# Patient Record
Sex: Female | Born: 1995 | Race: White | Hispanic: No | Marital: Single | Smoking: Never smoker
Health system: Southern US, Community
[De-identification: ages and names within clinical notes are randomized; demographics above are authoritative.]

## PROBLEM LIST (undated history)

## (undated) DIAGNOSIS — E8881 Metabolic syndrome: Secondary | ICD-10-CM

## (undated) DIAGNOSIS — E88819 Insulin resistance, unspecified: Secondary | ICD-10-CM

## (undated) DIAGNOSIS — F419 Anxiety disorder, unspecified: Secondary | ICD-10-CM

## (undated) HISTORY — DX: Metabolic syndrome: E88.81

## (undated) HISTORY — DX: Insulin resistance, unspecified: E88.819

## (undated) HISTORY — DX: Anxiety disorder, unspecified: F41.9

---

## 2020-01-28 ENCOUNTER — Encounter: Payer: Self-pay | Admitting: Family Medicine

## 2020-01-28 ENCOUNTER — Ambulatory Visit (INDEPENDENT_AMBULATORY_CARE_PROVIDER_SITE_OTHER): Payer: BC Managed Care – PPO | Admitting: Family Medicine

## 2020-01-28 DIAGNOSIS — F418 Other specified anxiety disorders: Secondary | ICD-10-CM | POA: Diagnosis not present

## 2020-01-28 MED ORDER — ESCITALOPRAM OXALATE 10 MG PO TABS: 10.0000 mg | ORAL_TABLET | Freq: Every day | ORAL | 1 refills | Status: DC

## 2020-01-28 NOTE — Progress Notes (Signed)
Rhonda Hubbard - 24 y.o. female MRN [id number]  Date of birth: 1995-11-16  Subjective Chief Complaint  Patient presents with  . Establish Care    HPI Rhonda Hubbard is a 24 y.o. female here today for initial visit.  She is concerned about anxiety and depression.  She reports that she has had feelings of fatigue, poor sleep, changes to appetite, decreased libido, and concentration difficulty.  She has also had a significant amount of anxiety.  She finds her sleeping difficulty seems to be related to anxiety as she wakes up with ruminating thoughts.  She has felt this way for a few years but has never sought any treatment for this.    She was recently seen by a weight loss physician and had labs completed including testosterone levels, insulin levels and TSH.  She was told that her insulin levels were abnormal.   Depression screen [facility] 2/9 01/28/2020  Decreased Interest 1  Down, Depressed, Hopeless 1  PHQ - 2 Score 2  Altered sleeping 2  Tired, decreased energy 3  Change in appetite 2  Feeling bad or failure about yourself  2  Trouble concentrating 1  Moving slowly or fidgety/restless 0  Suicidal thoughts 0  PHQ-9 Score 12  Difficult doing work/chores Somewhat difficult   ROS:  A comprehensive ROS was completed and negative except as noted per HPI  No Known Allergies  History reviewed. No pertinent past medical history.  History reviewed. No pertinent surgical history.  Social History   Socioeconomic History  . Marital status: Single    Spouse name: Not on file  . Number of children: Not on file  . Years of education: Not on file  . Highest education level: Not on file  Occupational History  . Not on file  Tobacco Use  . Smoking status: Never Smoker  . Smokeless tobacco: Never Used  Vaping Use  . Vaping Use: Never used  Substance and Sexual Activity  . Alcohol use: Yes    Alcohol/week: 1.0 - 2.0 standard drink    Types: 1 - 2 Standard drinks or equivalent per week  .  Drug use: Never  . Sexual activity: Not Currently    Partners: Male    Birth control/protection: Abstinence  Other Topics Concern  . Not on file  Social History Narrative  . Not on file   Social Determinants of Health   Financial Resource Strain:   . Difficulty of Paying Living Expenses: Not on file  Food Insecurity:   . Worried About Programme researcher, broadcasting/film/video in the Last Year: Not on file  . Ran Out of Food in the Last Year: Not on file  Transportation Needs:   . Lack of Transportation (Medical): Not on file  . Lack of Transportation (Non-Medical): Not on file  Physical Activity:   . Days of Exercise per Week: Not on file  . Minutes of Exercise per Session: Not on file  Stress:   . Feeling of Stress : Not on file  Social Connections:   . Frequency of Communication with Friends and Family: Not on file  . Frequency of Social Gatherings with Friends and Family: Not on file  . Attends Religious Services: Not on file  . Active Member of Clubs or Organizations: Not on file  . Attends Banker Meetings: Not on file  . Marital Status: Not on file    Family History  Problem Relation Age of Onset  . Hypertension Father   . Stroke Father   .  Breast cancer Maternal Aunt     Health Maintenance  Topic Date Due  . Hepatitis C Screening  Never done  . HIV Screening  Never done  . PAP-Cervical Cytology Screening  Never done  . PAP SMEAR-Modifier  Never done  . TETANUS/TDAP  08/09/2018  . INFLUENZA VACCINE  Never done  . COVID-19 Vaccine  Completed     ----------------------------------------------------------------------------------------------------------------------------------------------------------------------------------------------------------------- Physical Exam BP 138/71 (BP Location: Left Arm, Patient Position: Sitting, Cuff Size: Normal)   Pulse 80   Temp 98.1 F (36.7 C)   Ht 5\' 3"  (1.6 m)   Wt 189 lb 9.6 oz (86 kg)   LMP 01/24/2020 (Exact Date)   SpO2  100%   BMI 33.59 kg/m   Physical Exam Constitutional:      Appearance: Normal appearance.  HENT:     Head: Normocephalic and atraumatic.  Eyes:     General: No scleral icterus. Cardiovascular:     Rate and Rhythm: Normal rate and regular rhythm.  Musculoskeletal:     Cervical back: Neck supple.  Skin:    General: Skin is warm and dry.  Neurological:     General: No focal deficit present.     Mental Status: She is alert.  Psychiatric:        Mood and Affect: Mood normal.        Behavior: Behavior normal.     ------------------------------------------------------------------------------------------------------------------------------------------------------------------------------------------------------------------- Assessment and Plan  Depression with anxiety Her symptoms are mainly anxiety predominant and we discussed options for management.  Will start lexapro 5mg  initially with increase to 10mg  after 1 week.  Potential side effects reviewed.  Recommend that she consider addition of counseling as well. Follow up in 4-6 weeks.     Meds ordered this encounter  Medications  . escitalopram (LEXAPRO) 10 MG tablet    Sig: Take 1 tablet (10 mg total) by mouth daily. Take 5mg  daily x7 days, then increase to 10mg  daily.    Dispense:  30 tablet    Refill:  1    Return in about 5 weeks (around 03/03/2020) for Anxiety.    This visit occurred during the SARS-CoV-2 public health emergency.  Safety protocols were in place, including screening questions prior to the visit, additional usage of staff PPE, and extensive cleaning of exam room while observing appropriate contact time as indicated for disinfecting solutions.

## 2020-01-28 NOTE — Patient Instructions (Addendum)
Great to meet you today! Let's try lexapro (escitalopram).  Start at 1/2 tab for the first week, then increase to a full tab.   Consider seeing a counselor/therapist as well.  If you need help setting up an appointment let me know.  See me again in 4-6 weeks.    Generalized Anxiety Disorder, Adult Generalized anxiety disorder (GAD) is a mental health disorder. People with this condition constantly worry about everyday events. Unlike normal anxiety, worry related to GAD is not triggered by a specific event. These worries also do not fade or get better with time. GAD interferes with life functions, including relationships, work, and school. GAD can vary from mild to severe. People with severe GAD can have intense waves of anxiety with physical symptoms (panic attacks). What are the causes? The exact cause of GAD is not known. What increases the risk? This condition is more likely to develop in:  Women.  People who have a family history of anxiety disorders.  People who are very shy.  People who experience very stressful life events, such as the death of a loved one.  People who have a very stressful family environment. What are the signs or symptoms? People with GAD often worry excessively about many things in their lives, such as their health and family. They may also be overly concerned about:  Doing well at work.  Being on time.  Natural disasters.  Friendships. Physical symptoms of GAD include:  Fatigue.  Muscle tension or having muscle twitches.  Trembling or feeling shaky.  Being easily startled.  Feeling like your heart is pounding or racing.  Feeling out of breath or like you cannot take a deep breath.  Having trouble falling asleep or staying asleep.  Sweating.  Nausea, diarrhea, or irritable bowel syndrome (IBS).  Headaches.  Trouble concentrating or remembering facts.  Restlessness.  Irritability. How is this diagnosed? Your health care provider  can diagnose GAD based on your symptoms and medical history. You will also have a physical exam. The health care provider will ask specific questions about your symptoms, including how severe they are, when they started, and if they come and go. Your health care provider may ask you about your use of alcohol or drugs, including prescription medicines. Your health care provider may refer you to a mental health specialist for further evaluation. Your health care provider will do a thorough examination and may perform additional tests to rule out other possible causes of your symptoms. To be diagnosed with GAD, a person must have anxiety that:  Is out of his or her control.  Affects several different aspects of his or her life, such as work and relationships.  Causes distress that makes him or her unable to take part in normal activities.  Includes at least three physical symptoms of GAD, such as restlessness, fatigue, trouble concentrating, irritability, muscle tension, or sleep problems. Before your health care provider can confirm a diagnosis of GAD, these symptoms must be present more days than they are not, and they must last for six months or longer. How is this treated? The following therapies are usually used to treat GAD:  Medicine. Antidepressant medicine is usually prescribed for long-term daily control. Antianxiety medicines may be added in severe cases, especially when panic attacks occur.  Talk therapy (psychotherapy). Certain types of talk therapy can be helpful in treating GAD by providing support, education, and guidance. Options include: ? Cognitive behavioral therapy (CBT). People learn coping skills and techniques to ease  their anxiety. They learn to identify unrealistic or negative thoughts and behaviors and to replace them with positive ones. ? Acceptance and commitment therapy (ACT). This treatment teaches people how to be mindful as a way to cope with unwanted thoughts and  feelings. ? Biofeedback. This process trains you to manage your body's response (physiological response) through breathing techniques and relaxation methods. You will work with a therapist while machines are used to monitor your physical symptoms.  Stress management techniques. These include yoga, meditation, and exercise. A mental health specialist can help determine which treatment is best for you. Some people see improvement with one type of therapy. However, other people require a combination of therapies. Follow these instructions at home:  Take over-the-counter and prescription medicines only as told by your health care provider.  Try to maintain a normal routine.  Try to anticipate stressful situations and allow extra time to manage them.  Practice any stress management or self-calming techniques as taught by your health care provider.  Do not punish yourself for setbacks or for not making progress.  Try to recognize your accomplishments, even if they are small.  Keep all follow-up visits as told by your health care provider. This is important. Contact a health care provider if:  Your symptoms do not get better.  Your symptoms get worse.  You have signs of depression, such as: ? A persistently sad, cranky, or irritable mood. ? Loss of enjoyment in activities that used to bring you joy. ? Change in weight or eating. ? Changes in sleeping habits. ? Avoiding friends or family members. ? Loss of energy for normal tasks. ? Feelings of guilt or worthlessness. Get help right away if:  You have serious thoughts about hurting yourself or others. If you ever feel like you may hurt yourself or others, or have thoughts about taking your own life, get help right away. You can go to your nearest emergency department or call:  Your local emergency services (911 in the [place]  A suicide crisis helpline, such as the National Suicide Prevention Lifeline at 979-549-5707. This is open  24 hours a day. Summary  Generalized anxiety disorder (GAD) is a mental health disorder that involves worry that is not triggered by a specific event.  People with GAD often worry excessively about many things in their lives, such as their health and family.  GAD may cause physical symptoms such as restlessness, trouble concentrating, sleep problems, frequent sweating, nausea, diarrhea, headaches, and trembling or muscle twitching.  A mental health specialist can help determine which treatment is best for you. Some people see improvement with one type of therapy. However, other people require a combination of therapies. This information is not intended to replace advice given to you by your health care provider. Make sure you discuss any questions you have with your health care provider. Document Revised: 01/28/2017 Document Reviewed: 01/06/2016 Elsevier Patient Education  2020 [employer].

## 2020-01-28 NOTE — Assessment & Plan Note (Signed)
Her symptoms are mainly anxiety predominant and we discussed options for management.  Will start lexapro 5mg  initially with increase to 10mg  after 1 week.  Potential side effects reviewed.  Recommend that she consider addition of counseling as well. Follow up in 4-6 weeks.

## 2020-01-29 ENCOUNTER — Encounter: Payer: Self-pay | Admitting: Family Medicine

## 2020-01-31 ENCOUNTER — Encounter: Payer: Self-pay | Admitting: Family Medicine

## 2020-01-31 ENCOUNTER — Other Ambulatory Visit: Payer: Self-pay

## 2020-01-31 ENCOUNTER — Ambulatory Visit (INDEPENDENT_AMBULATORY_CARE_PROVIDER_SITE_OTHER): Payer: BC Managed Care – PPO | Admitting: Family Medicine

## 2020-01-31 DIAGNOSIS — F418 Other specified anxiety disorders: Secondary | ICD-10-CM | POA: Diagnosis not present

## 2020-01-31 MED ORDER — LORAZEPAM 0.5 MG PO TABS
0.2500 mg | ORAL_TABLET | Freq: Two times a day (BID) | ORAL | 0 refills | Status: DC | PRN
Start: 2020-01-31 — End: 2020-02-28

## 2020-01-31 NOTE — Progress Notes (Signed)
Rhonda Hubbard - 24 y.o. female MRN [id number]  Date of birth: 02-25-1996  Subjective Chief Complaint  Patient presents with  . Medication Problem    HPI Rhonda Hubbard is a 24 y.o. female here today for follow up of depression and anxiety.  She was started on lexapro at visit last week. She took first dose around 3 days ago.  She had some nausea at first but then developed increased anxiety with racing heart, increased insomnia and shaking.  She had to leave work today because of severity of symptoms.  She has not had any symptoms of allergic reaction.   ROS:  A comprehensive ROS was completed and negative except as noted per HPI  No Known Allergies  History reviewed. No pertinent past medical history.  History reviewed. No pertinent surgical history.  Social History   Socioeconomic History  . Marital status: Single    Spouse name: Not on file  . Number of children: Not on file  . Years of education: Not on file  . Highest education level: Not on file  Occupational History  . Not on file  Tobacco Use  . Smoking status: Never Smoker  . Smokeless tobacco: Never Used  Vaping Use  . Vaping Use: Never used  Substance and Sexual Activity  . Alcohol use: Yes    Alcohol/week: 1.0 - 2.0 standard drink    Types: 1 - 2 Standard drinks or equivalent per week  . Drug use: Never  . Sexual activity: Not Currently    Partners: Male    Birth control/protection: Abstinence  Other Topics Concern  . Not on file  Social History Narrative  . Not on file   Social Determinants of Health   Financial Resource Strain:   . Difficulty of Paying Living Expenses: Not on file  Food Insecurity:   . Worried About Programme researcher, broadcasting/film/video in the Last Year: Not on file  . Ran Out of Food in the Last Year: Not on file  Transportation Needs:   . Lack of Transportation (Medical): Not on file  . Lack of Transportation (Non-Medical): Not on file  Physical Activity:   . Days of Exercise per Week: Not on file   . Minutes of Exercise per Session: Not on file  Stress:   . Feeling of Stress : Not on file  Social Connections:   . Frequency of Communication with Friends and Family: Not on file  . Frequency of Social Gatherings with Friends and Family: Not on file  . Attends Religious Services: Not on file  . Active Member of Clubs or Organizations: Not on file  . Attends Banker Meetings: Not on file  . Marital Status: Not on file    Family History  Problem Relation Age of Onset  . Hypertension Father   . Stroke Father   . Breast cancer Maternal Aunt     Health Maintenance  Topic Date Due  . Hepatitis C Screening  Never done  . HIV Screening  Never done  . PAP-Cervical Cytology Screening  Never done  . PAP SMEAR-Modifier  Never done  . TETANUS/TDAP  08/09/2018  . INFLUENZA VACCINE  Never done  . COVID-19 Vaccine  Completed     ----------------------------------------------------------------------------------------------------------------------------------------------------------------------------------------------------------------- Physical Exam BP (!) 150/89 (BP Location: Left Arm, Patient Position: Sitting, Cuff Size: Normal)   Pulse (!) 105   Temp 98.2 F (36.8 C)   Wt 186 lb 3.2 oz (84.5 kg)   LMP 01/24/2020 (Exact Date)   SpO2  97%   BMI 32.98 kg/m   Physical Exam Constitutional:      Appearance: Normal appearance.  HENT:     Head: Normocephalic and atraumatic.  Neurological:     Mental Status: She is alert.  Psychiatric:        Attention and Perception: Attention normal.        Mood and Affect: Mood is anxious.        Speech: Speech normal.        Behavior: Behavior normal.        Thought Content: Thought content normal.        Cognition and Memory: Cognition normal.        Judgment: Judgment normal.      ------------------------------------------------------------------------------------------------------------------------------------------------------------------------------------------------------------------- Assessment and Plan  Depression with anxiety She seems to be having paradoxical effect related to lexapro.  Will discontinue and give a few days to wash out.  Short term lorazepam prescribed as needed to help mitigate current side effects she is experiencing.  We discussed possibly trying sertraline at next visit.  I will follow up with her in about 1 week.  She is instructed to contact the clinic if symptoms are worsening.     Meds ordered this encounter  Medications  . LORazepam (ATIVAN) 0.5 MG tablet    Sig: Take 0.5-1 tablets (0.25-0.5 mg total) by mouth 2 (two) times daily as needed for anxiety.    Dispense:  10 tablet    Refill:  0    No follow-ups on file.    This visit occurred during the SARS-CoV-2 public health emergency.  Safety protocols were in place, including screening questions prior to the visit, additional usage of staff PPE, and extensive cleaning of exam room while observing appropriate contact time as indicated for disinfecting solutions.

## 2020-01-31 NOTE — Assessment & Plan Note (Signed)
She seems to be having paradoxical effect related to lexapro.  Will discontinue and give a few days to wash out.  Short term lorazepam prescribed as needed to help mitigate current side effects she is experiencing.  We discussed possibly trying sertraline at next visit.  I will follow up with her in about 1 week.  She is instructed to contact the clinic if symptoms are worsening.

## 2020-01-31 NOTE — Telephone Encounter (Signed)
Patient called office upset and worried about possible medication reaction. Patient reports she has taken 3 doses and is experiencing a lot of shaking and she feels her heart is beating very fast. Denies any swelling, chest pain, or shortness of breath. Patient takes medication in PM, last dose was last night.   Patient has been added to schedule for 1:40 today for evaluation. Offered patient earliest appointments and she was not able to make them. She is having someone come get her from work and I advised her it was not safe to drive if she was shaking and not able to work.   Patient aware that if any new or worsening of SX she needs to seek urgent evaluation at [facility] or ED  FYI to PCP

## 2020-01-31 NOTE — Patient Instructions (Signed)
Discontinue lexapro.  Try lorazepam up to two times per day as needed.  Try 1/2 tab initially.  Let's follow up in 1 week.

## 2020-02-06 ENCOUNTER — Telehealth (INDEPENDENT_AMBULATORY_CARE_PROVIDER_SITE_OTHER): Payer: BC Managed Care – PPO | Admitting: Family Medicine

## 2020-02-06 ENCOUNTER — Encounter: Payer: Self-pay | Admitting: Family Medicine

## 2020-02-06 DIAGNOSIS — F418 Other specified anxiety disorders: Secondary | ICD-10-CM | POA: Diagnosis not present

## 2020-02-06 MED ORDER — SERTRALINE HCL 25 MG PO TABS
ORAL_TABLET | ORAL | 3 refills | Status: DC
Start: 2020-02-06 — End: 2020-03-23

## 2020-02-06 MED ORDER — SERTRALINE HCL 25 MG PO TABS: ORAL_TABLET | ORAL | 3 refills | Status: DC

## 2020-02-06 NOTE — Assessment & Plan Note (Addendum)
We will start sertraline as discussed.  Start initially at 12.5 mg with increase to 25 mg after 1 week.  She may utilize leftover lorazepam if needed for now she is having increased anxiety while starting with medication.  I will plan to follow-up with her in about 2 weeks to be sure she is tolerating medication.

## 2020-02-06 NOTE — Progress Notes (Signed)
Rhonda Hubbard - 24 y.o. female MRN [id number]  Date of birth: [birth year]   This visit type was conducted due to national recommendations for restrictions regarding the COVID-19 Pandemic (e.g. social distancing).  This format is felt to be most appropriate for this patient at this time.  All issues noted in this document were discussed and addressed.  No physical exam was performed (except for noted visual exam findings with Video Visits).  I discussed the limitations of evaluation and management by telemedicine and the availability of in person appointments. The patient expressed understanding and agreed to proceed.  I connected with@ on 02/06/20 at  1:00 PM EST by a video enabled telemedicine application and verified that I am speaking with the correct person using two identifiers.  Present at visit: Everrett Coombe, DO [facility]   Patient Location: School/Work [address] [place] [address]   Provider location:   Home  Chief Complaint  Patient presents with  . Anxiety    HPI  Rhonda Hubbard is a 24 y.o. female who presents via audio/video conferencing for a telehealth visit today.  [name] is following up today for depression and anxiety.  We had initially tried Lexapro however she had paradoxical reaction to this and significant increase in anxiety.  I had her discontinue this with a washout period over the past week.  I did provide short-term lorazepam as needed to help with her increased anxiety.  She did utilize this a couple of times and found this to be helpful.  Discussed her this is not for long-term management of her anxiety.  We did discuss trying sertraline to replace Lexapro.  She is open to trying this but is somewhat worried about possible side effects.  We did also discuss maybe adding BuSpar if needed if she continues to have anxiety.     ROS:  A comprehensive ROS was completed and negative except as noted per HPI  No past medical history on file.  No  past surgical history on file.  Family History  Problem Relation Age of Onset  . Hypertension Father   . Stroke Father   . Breast cancer Maternal Aunt     Social History   Socioeconomic History  . Marital status: Single    Spouse name: Not on file  . Number of children: Not on file  . Years of education: Not on file  . Highest education level: Not on file  Occupational History  . Not on file  Tobacco Use  . Smoking status: Never Smoker  . Smokeless tobacco: Never Used  Vaping Use  . Vaping Use: Never used  Substance and Sexual Activity  . Alcohol use: Yes    Alcohol/week: 1.0 - 2.0 standard drink    Types: 1 - 2 Standard drinks or equivalent per week  . Drug use: Never  . Sexual activity: Not Currently    Partners: Male    Birth control/protection: Abstinence  Other Topics Concern  . Not on file  Social History Narrative  . Not on file   Social Determinants of Health   Financial Resource Strain:   . Difficulty of Paying Living Expenses: Not on file  Food Insecurity:   . Worried About Programme researcher, broadcasting/film/video in the Last Year: Not on file  . Ran Out of Food in the Last Year: Not on file  Transportation Needs:   . Lack of Transportation (Medical): Not on file  . Lack of Transportation (Non-Medical): Not on file  Physical Activity:   . Days of Exercise per Week: Not on file  . Minutes of Exercise per Session: Not on file  Stress:   . Feeling of Stress : Not on file  Social Connections:   . Frequency of Communication with Friends and Family: Not on file  . Frequency of Social Gatherings with Friends and Family: Not on file  . Attends Religious Services: Not on file  . Active Member of Clubs or Organizations: Not on file  . Attends Banker Meetings: Not on file  . Marital Status: Not on file  Intimate Partner Violence:   . Fear of Current or Ex-Partner: Not on file  . Emotionally Abused: Not on file  . Physically Abused: Not on file  . Sexually  Abused: Not on file     Current Outpatient Medications:  .  LORazepam (ATIVAN) 0.5 MG tablet, Take 0.5-1 tablets (0.25-0.5 mg total) by mouth 2 (two) times daily as needed for anxiety., Disp: 10 tablet, Rfl: 0 .  sertraline (ZOLOFT) 25 MG tablet, Take 12.5mg  daily x7 days then increase to 25mg  daily, Disp: 30 tablet, Rfl: 3  EXAM:  VITALS per patient if applicable: LMP 01/24/2020 (Exact Date)   GENERAL: alert, oriented, appears well and in no acute distress  HEENT: atraumatic, conjunttiva clear, no obvious abnormalities on inspection of external nose and ears  NECK: normal movements of the head and neck  LUNGS: on inspection no signs of respiratory distress, breathing rate appears normal, no obvious gross SOB, gasping or wheezing  CV: no obvious cyanosis  MS: moves all visible extremities without noticeable abnormality  PSYCH/NEURO: pleasant and cooperative, no obvious depression or anxiety, speech and thought processing grossly intact  ASSESSMENT AND PLAN:  Discussed the following assessment and plan:  Depression with anxiety We will start sertraline as discussed.  Start initially at 12.5 mg with increase to 25 mg after 1 week.  She may utilize leftover lorazepam if needed for now she is having increased anxiety while starting with medication.  I will plan to follow-up with her in about 2 weeks to be sure she is tolerating medication.     I discussed the assessment and treatment plan with the patient. The patient was provided an opportunity to ask questions and all were answered. The patient agreed with the plan and demonstrated an understanding of the instructions.   The patient was advised to call back or seek an in-person evaluation if the symptoms worsen or if the condition fails to improve as anticipated.    01/26/2020, DO

## 2020-02-19 ENCOUNTER — Encounter: Payer: Self-pay | Admitting: Family Medicine

## 2020-02-19 ENCOUNTER — Other Ambulatory Visit: Payer: Self-pay

## 2020-02-19 ENCOUNTER — Ambulatory Visit (INDEPENDENT_AMBULATORY_CARE_PROVIDER_SITE_OTHER): Payer: BC Managed Care – PPO | Admitting: Family Medicine

## 2020-02-19 VITALS — BP 128/82 | HR 87 | Temp 97.4°F | Ht 62.8 in | Wt 184.7 lb

## 2020-02-19 DIAGNOSIS — Z111 Encounter for screening for respiratory tuberculosis: Secondary | ICD-10-CM | POA: Diagnosis not present

## 2020-02-19 DIAGNOSIS — Z23 Encounter for immunization: Secondary | ICD-10-CM | POA: Diagnosis not present

## 2020-02-19 DIAGNOSIS — Z124 Encounter for screening for malignant neoplasm of cervix: Secondary | ICD-10-CM | POA: Diagnosis not present

## 2020-02-19 DIAGNOSIS — Z0000 Encounter for general adult medical examination without abnormal findings: Secondary | ICD-10-CM | POA: Insufficient documentation

## 2020-02-19 DIAGNOSIS — R5383 Other fatigue: Secondary | ICD-10-CM | POA: Diagnosis not present

## 2020-02-19 LAB — COMPLETE METABOLIC PANEL WITH GFR
AG Ratio: 1.8 (calc) (ref 1.0–2.5)
ALT: 12 U/L (ref 6–29)
AST: 13 U/L (ref 10–30)
Albumin: 4.6 g/dL (ref 3.6–5.1)
Alkaline phosphatase (APISO): 59 U/L (ref 31–125)
BUN/Creatinine Ratio: 9 (calc) (ref 6–22)
BUN: 6 mg/dL — ABNORMAL LOW (ref 7–25)
CO2: 25 mmol/L (ref 20–32)
Calcium: 9.4 mg/dL (ref 8.6–10.2)
Chloride: 105 mmol/L (ref 98–110)
Creat: 0.67 mg/dL (ref 0.50–1.10)
GFR, Est African American: 144 mL/min/{1.73_m2} (ref >=60)
GFR, Est Non African American: 124 mL/min/{1.73_m2} (ref >=60)
Globulin: 2.6 g/dL (calc) (ref 1.9–3.7)
Glucose, Bld: 88 mg/dL (ref 65–99)
Potassium: 4.2 mmol/L (ref 3.5–5.3)
Sodium: 139 mmol/L (ref 135–146)
Total Bilirubin: 0.7 mg/dL (ref 0.2–1.2)
Total Protein: 7.2 g/dL (ref 6.1–8.1)

## 2020-02-19 LAB — CBC
HCT: 44.1 % (ref 35.0–45.0)
Hemoglobin: 15.6 g/dL — ABNORMAL HIGH (ref 11.7–15.5)
MCH: 32.8 pg (ref 27.0–33.0)
MCHC: 35.4 g/dL (ref 32.0–36.0)
MCV: 92.6 fL (ref 80.0–100.0)
MPV: 11.3 fL (ref 7.5–12.5)
Platelets: 282 10*3/uL (ref 140–400)
RBC: 4.76 10*6/uL (ref 3.80–5.10)
RDW: 12.2 % (ref 11.0–15.0)
WBC: 9 10*3/uL (ref 3.8–10.8)

## 2020-02-19 LAB — VITAMIN B12: Vitamin B-12: 687 pg/mL (ref 200–1100)

## 2020-02-19 LAB — TSH: TSH: 1.39 mIU/L

## 2020-02-19 NOTE — Progress Notes (Signed)
Pt is here for PPD placement.  Location: right forearm Time: 10:30am  Patient tolerated injection well without complications.

## 2020-02-19 NOTE — Progress Notes (Signed)
Rhonda Hubbard - 24 y.o. female MRN [id number]  Date of birth: Jun 11, 1995  Subjective Chief Complaint  Patient presents with  . Annual Exam    HPI Rhonda Hubbard is a 24 year old female here today for annual exam.  She has a history of anxiety which we are working on managing.  She does feel little better now that school is out for winter break.  She had initially started on Lexapro however did not tolerate.  She has most recently started on sertraline.  She has felt a little panicky with some palpitations at times since starting this but this seems to be improving.  She has also had some diarrhea which also seems to be improving some.  She would like to have updated labs today.  She has had difficulty with weight loss and would like to have labs for thyroid check.  She is a non-smoker.  She consumes alcohol occasionally in social settings.  She exercises occasionally and has been working on making changes to her diet.  She has never had pap, would like referral to GYN  Needs TB test for school.   Review of Systems  Constitutional: Negative for chills, fever, malaise/fatigue and weight loss.  HENT: Negative for congestion, ear pain and sore throat.   Eyes: Negative for blurred vision, double vision and pain.  Respiratory: Negative for cough and shortness of breath.   Cardiovascular: Negative for chest pain and palpitations.  Gastrointestinal: Negative for abdominal pain, blood in stool, constipation, heartburn and nausea.  Genitourinary: Negative for dysuria and urgency.  Musculoskeletal: Negative for joint pain and myalgias.  Neurological: Negative for dizziness and headaches.  Endo/Heme/Allergies: Does not bruise/bleed easily.  Psychiatric/Behavioral: Negative for depression. The patient is not nervous/anxious and does not have insomnia.      No Known Allergies  History reviewed. No pertinent past medical history.  History reviewed. No pertinent surgical history.  Social  History   Socioeconomic History  . Marital status: Single    Spouse name: Not on file  . Number of children: Not on file  . Years of education: Not on file  . Highest education level: Not on file  Occupational History  . Not on file  Tobacco Use  . Smoking status: Never Smoker  . Smokeless tobacco: Never Used  Vaping Use  . Vaping Use: Never used  Substance and Sexual Activity  . Alcohol use: Yes    Alcohol/week: 1.0 - 2.0 standard drink    Types: 1 - 2 Standard drinks or equivalent per week  . Drug use: Never  . Sexual activity: Not Currently    Partners: Male    Birth control/protection: Abstinence  Other Topics Concern  . Not on file  Social History Narrative  . Not on file   Social Determinants of Health   Financial Resource Strain: Not on file  Food Insecurity: Not on file  Transportation Needs: Not on file  Physical Activity: Not on file  Stress: Not on file  Social Connections: Not on file    Family History  Problem Relation Age of Onset  . Hypertension Father   . Stroke Father   . Breast cancer Maternal Aunt     Health Maintenance  Topic Date Due  . Hepatitis C Screening  Never done  . HIV Screening  Never done  . PAP-Cervical Cytology Screening  Never done  . PAP SMEAR-Modifier  Never done  . INFLUENZA VACCINE  Never done  . TETANUS/TDAP  02/18/2030  . COVID-19 Vaccine  Completed     ----------------------------------------------------------------------------------------------------------------------------------------------------------------------------------------------------------------- Physical Exam BP 128/82 (BP Location: Left Arm, Patient Position: Sitting, Cuff Size: Normal)   Pulse 87   Temp (!) 97.4 F (36.3 C)   Ht 5' 2.8" (1.595 m)   Wt 184 lb 11.2 oz (83.8 kg)   LMP 01/24/2020 (Exact Date)   SpO2 99%   BMI 32.93 kg/m   Physical Exam Constitutional:      General: She is not in acute distress.    Appearance: She is  well-nourished.  HENT:     Head: Normocephalic and atraumatic.     Nose: Nose normal.     Mouth/Throat:     Mouth: Oropharynx is clear and moist.  Eyes:     General: No scleral icterus.    Conjunctiva/sclera: Conjunctivae normal.  Neck:     Thyroid: No thyromegaly.  Cardiovascular:     Rate and Rhythm: Normal rate and regular rhythm.     Heart sounds: Normal heart sounds.  Pulmonary:     Effort: Pulmonary effort is normal.     Breath sounds: Normal breath sounds.  Abdominal:     General: Bowel sounds are normal. There is no distension.     Palpations: Abdomen is soft.     Tenderness: There is no abdominal tenderness. There is no guarding.  Musculoskeletal:        General: No edema. Normal range of motion.     Cervical back: Normal range of motion and neck supple.  Lymphadenopathy:     Cervical: No cervical adenopathy.  Skin:    General: Skin is warm and dry.     Findings: No rash.  Neurological:     Mental Status: She is alert and oriented to person, place, and time.     Cranial Nerves: No cranial nerve deficit.     Coordination: Coordination normal.  Psychiatric:        Mood and Affect: Mood and affect normal.        Behavior: Behavior normal.     ------------------------------------------------------------------------------------------------------------------------------------------------------------------------------------------------------------------- Assessment and Plan  Well adult exam Well adult Orders Placed This Encounter  Procedures  . Tdap vaccine greater than or equal to 7yo IM  . COMPLETE METABOLIC PANEL WITH GFR  . CBC  . TSH  . Vitamin D (25 hydroxy)  . B12  . Ambulatory referral to Obstetrics / Gynecology    Referral Priority:   Routine    Referral Type:   Consultation    Referral Reason:   Specialty Services Required    Requested Specialty:   Obstetrics and Gynecology    Number of Visits Requested:   1  . PPD    Order Specific Question:    Has patient ever tested positive?    Answer:   No  Screening: Referral placed to GYN for pap smears.  Immunizations:  Tdap given.  Declines flu vaccine.  Anticipatory guidance/Risk factor reduction: recommendations per AVS   No orders of the defined types were placed in this encounter.   Return in about 2 years (around 02/18/2022) for PPD Read.    This visit occurred during the SARS-CoV-2 public health emergency.  Safety protocols were in place, including screening questions prior to the visit, additional usage of staff PPE, and extensive cleaning of exam room while observing appropriate contact time as indicated for disinfecting solutions.

## 2020-02-19 NOTE — Assessment & Plan Note (Signed)
Well adult Orders Placed This Encounter  Procedures  . Tdap vaccine greater than or equal to 23yo IM  . COMPLETE METABOLIC PANEL WITH GFR  . CBC  . TSH  . Vitamin D (25 hydroxy)  . B12  . Ambulatory referral to Obstetrics / Gynecology    Referral Priority:   Routine    Referral Type:   Consultation    Referral Reason:   Specialty Services Required    Requested Specialty:   Obstetrics and Gynecology    Number of Visits Requested:   1  . PPD    Order Specific Question:   Has patient ever tested positive?    Answer:   No  Screening: Referral placed to GYN for pap smears.  Immunizations:  Tdap given.  Declines flu vaccine.  Anticipatory guidance/Risk factor reduction: recommendations per AVS

## 2020-02-19 NOTE — Patient Instructions (Signed)
Preventive Care 21-23 Years Old, Female Preventive care refers to visits with your health care provider and lifestyle choices that can promote health and wellness. This includes:  A yearly physical exam. This may also be called an annual well check.  Regular dental visits and eye exams.  Immunizations.  Screening for certain conditions.  Healthy lifestyle choices, such as eating a healthy diet, getting regular exercise, not using drugs or products that contain nicotine and tobacco, and limiting alcohol use. What can I expect for my preventive care visit? Physical exam Your health care provider will check your:  Height and weight. This may be used to calculate body mass index (BMI), which tells if you are at a healthy weight.  Heart rate and blood pressure.  Skin for abnormal spots. Counseling Your health care provider may ask you questions about your:  Alcohol, tobacco, and drug use.  Emotional well-being.  Home and relationship well-being.  Sexual activity.  Eating habits.  Work and work environment.  Method of birth control.  Menstrual cycle.  Pregnancy history. What immunizations do I need?  Influenza (flu) vaccine  This is recommended every year. Tetanus, diphtheria, and pertussis (Tdap) vaccine  You may need a Td booster every 10 years. Varicella (chickenpox) vaccine  You may need this if you have not been vaccinated. Human papillomavirus (HPV) vaccine  If recommended by your health care provider, you may need three doses over 6 months. Measles, mumps, and rubella (MMR) vaccine  You may need at least one dose of MMR. You may also need a second dose. Meningococcal conjugate (MenACWY) vaccine  One dose is recommended if you are age 19-21 years and a first-year college student living in a residence hall, or if you have one of several medical conditions. You may also need additional booster doses. Pneumococcal conjugate (PCV13) vaccine  You may need  this if you have certain conditions and were not previously vaccinated. Pneumococcal polysaccharide (PPSV23) vaccine  You may need one or two doses if you smoke cigarettes or if you have certain conditions. Hepatitis A vaccine  You may need this if you have certain conditions or if you travel or work in places where you may be exposed to hepatitis A. Hepatitis B vaccine  You may need this if you have certain conditions or if you travel or work in places where you may be exposed to hepatitis B. Haemophilus influenzae type b (Hib) vaccine  You may need this if you have certain conditions. You may receive vaccines as individual doses or as more than one vaccine together in one shot (combination vaccines). Talk with your health care provider about the risks and benefits of combination vaccines. What tests do I need?  Blood tests  Lipid and cholesterol levels. These may be checked every 5 years starting at age 20.  Hepatitis C test.  Hepatitis B test. Screening  Diabetes screening. This is done by checking your blood sugar (glucose) after you have not eaten for a while (fasting).  Sexually transmitted disease (STD) testing.  BRCA-related cancer screening. This may be done if you have a family history of breast, ovarian, tubal, or peritoneal cancers.  Pelvic exam and Pap test. This may be done every 3 years starting at age 21. Starting at age 30, this may be done every 5 years if you have a Pap test in combination with an HPV test. Talk with your health care provider about your test results, treatment options, and if necessary, the need for more tests.   Follow these instructions at home: Eating and drinking   Eat a diet that includes fresh fruits and vegetables, whole grains, lean protein, and low-fat dairy.  Take vitamin and mineral supplements as recommended by your health care provider.  Do not drink alcohol if: ? Your health care provider tells you not to drink. ? You are  pregnant, may be pregnant, or are planning to become pregnant.  If you drink alcohol: ? Limit how much you have to 0-1 drink a day. ? Be aware of how much alcohol is in your drink. In the [place] one drink equals one 12 oz bottle of beer (355 mL), one 5 oz glass of wine (148 mL), or one 1 oz glass of hard liquor (44 mL). Lifestyle  Take daily care of your teeth and gums.  Stay active. Exercise for at least 30 minutes on 5 or more days each week.  Do not use any products that contain nicotine or tobacco, such as cigarettes, e-cigarettes, and chewing tobacco. If you need help quitting, ask your health care provider.  If you are sexually active, practice safe sex. Use a condom or other form of birth control (contraception) in order to prevent pregnancy and STIs (sexually transmitted infections). If you plan to become pregnant, see your health care provider for a preconception visit. What's next?  Visit your health care provider once a year for a well check visit.  Ask your health care provider how often you should have your eyes and teeth checked.  Stay up to date on all vaccines. This information is not intended to replace advice given to you by your health care provider. Make sure you discuss any questions you have with your health care provider. Document Revised: 10/27/2017 Document Reviewed: 10/27/2017 Elsevier Patient Education  2020 [employer].

## 2020-02-21 ENCOUNTER — Ambulatory Visit (INDEPENDENT_AMBULATORY_CARE_PROVIDER_SITE_OTHER): Payer: BC Managed Care – PPO | Admitting: Family Medicine

## 2020-02-21 ENCOUNTER — Other Ambulatory Visit: Payer: Self-pay

## 2020-02-21 DIAGNOSIS — Z111 Encounter for screening for respiratory tuberculosis: Secondary | ICD-10-CM

## 2020-02-21 LAB — TB SKIN TEST
Induration: 0 mm
TB Skin Test: NEGATIVE

## 2020-02-21 NOTE — Progress Notes (Signed)
Medical screening examination/treatment was performed by qualified clinical staff member and as supervising physician I was immediately available for consultation/collaboration. I have reviewed documentation and agree with assessment and plan.  Vikram Tillett, DO  

## 2020-02-21 NOTE — Progress Notes (Signed)
Pt here to have her PPD reading done.  Test was negative. Form completed and copy made to scan into chart.

## 2020-02-28 ENCOUNTER — Other Ambulatory Visit: Payer: Self-pay

## 2020-02-28 MED ORDER — LORAZEPAM 0.5 MG PO TABS: 0.2500 mg | ORAL_TABLET | Freq: Two times a day (BID) | ORAL | 0 refills | Status: DC | PRN

## 2020-03-10 ENCOUNTER — Encounter: Payer: Self-pay | Admitting: Family Medicine

## 2020-03-10 ENCOUNTER — Other Ambulatory Visit: Payer: Self-pay

## 2020-03-10 ENCOUNTER — Ambulatory Visit (INDEPENDENT_AMBULATORY_CARE_PROVIDER_SITE_OTHER): Payer: BC Managed Care – PPO | Admitting: Family Medicine

## 2020-03-10 DIAGNOSIS — F418 Other specified anxiety disorders: Secondary | ICD-10-CM

## 2020-03-10 DIAGNOSIS — E282 Polycystic ovarian syndrome: Secondary | ICD-10-CM | POA: Diagnosis not present

## 2020-03-10 MED ORDER — METFORMIN HCL ER 500 MG PO TB24: 500.0000 mg | ORAL_TABLET | Freq: Every day | ORAL | 1 refills | Status: DC

## 2020-03-10 NOTE — Assessment & Plan Note (Signed)
PCOS with associated obesity.  Has been on metformin previously however only took for a short period of time.  Will restart and see if this provides some improvement in her weight.  Discussed saxenda and wegovy, will let her know if new coupons become available for these.

## 2020-03-10 NOTE — Assessment & Plan Note (Signed)
She is doing well with sertraline at this point, still with some anxiety though.  Will continue at current strength for now but discussed she can try increase to 50mg  if symptoms are not continuing to improve.   Return in about 6 weeks (around 04/21/2020) for Anxiety.

## 2020-03-10 NOTE — Progress Notes (Signed)
Rhonda Hubbard - 25 y.o. female MRN [id number]  Date of birth: 02/05/1996  Subjective Chief Complaint  Patient presents with  . Anxiety    HPI Rhonda Hubbard is a 25 y.o. female here today for follow up of anxiety and depression.  She is tolerating sertraline much better than lexapro.  She reports that anxiety is much better controlled.  Denies significant depressive symptoms at this time.  She is no longer using lorazepam.    She would also like to discuss medications to help with weight management.  She has history of PCOS and has been treated with metformin previously but didn't take this very long.  She would be willing to try this again.  She may be interested in GLP-1 injectables as well if new coupons become available.   ROS:  A comprehensive ROS was completed and negative except as noted per HPI  No Known Allergies  History reviewed. No pertinent past medical history.  History reviewed. No pertinent surgical history.  Social History   Socioeconomic History  . Marital status: Single    Spouse name: Not on file  . Number of children: Not on file  . Years of education: Not on file  . Highest education level: Not on file  Occupational History  . Not on file  Tobacco Use  . Smoking status: Never Smoker  . Smokeless tobacco: Never Used  Vaping Use  . Vaping Use: Never used  Substance and Sexual Activity  . Alcohol use: Yes    Alcohol/week: 1.0 - 2.0 standard drink    Types: 1 - 2 Standard drinks or equivalent per week  . Drug use: Never  . Sexual activity: Not Currently    Partners: Male    Birth control/protection: Abstinence  Other Topics Concern  . Not on file  Social History Narrative  . Not on file   Social Determinants of Health   Financial Resource Strain: Not on file  Food Insecurity: Not on file  Transportation Needs: Not on file  Physical Activity: Not on file  Stress: Not on file  Social Connections: Not on file    Family History  Problem Relation  Age of Onset  . Hypertension Father   . Stroke Father   . Breast cancer Maternal Aunt     Health Maintenance  Topic Date Due  . Hepatitis C Screening  Never done  . HIV Screening  Never done  . PAP-Cervical Cytology Screening  Never done  . PAP SMEAR-Modifier  Never done  . INFLUENZA VACCINE  05/29/2020 (Originally 09/30/2019)  . TETANUS/TDAP  02/18/2030  . COVID-19 Vaccine  Completed     ----------------------------------------------------------------------------------------------------------------------------------------------------------------------------------------------------------------- Physical Exam BP 138/76 (BP Location: Left Arm, Patient Position: Sitting, Cuff Size: Normal)   Pulse 63   Temp 97.7 F (36.5 C)   Wt 188 lb (85.3 kg)   SpO2 99%   BMI 33.52 kg/m   Physical Exam Constitutional:      Appearance: Normal appearance.  Cardiovascular:     Rate and Rhythm: Normal rate and regular rhythm.  Pulmonary:     Effort: Pulmonary effort is normal.     Breath sounds: Normal breath sounds.  Neurological:     Mental Status: She is alert.  Psychiatric:        Mood and Affect: Mood normal.        Behavior: Behavior normal.     ------------------------------------------------------------------------------------------------------------------------------------------------------------------------------------------------------------------- Assessment and Plan  Depression with anxiety She is doing well with sertraline at this point, still with some  anxiety though.  Will continue at current strength for now but discussed she can try increase to 50mg  if symptoms are not continuing to improve.   Return in about 6 weeks (around 04/21/2020) for Anxiety.   PCOS (polycystic ovarian syndrome) PCOS with associated obesity.  Has been on metformin previously however only took for a short period of time.  Will restart and see if this provides some improvement in her weight.   Discussed saxenda and wegovy, will let her know if new coupons become available for these.     Meds ordered this encounter  Medications  . DISCONTD: metFORMIN (GLUCOPHAGE XR) 500 MG 24 hr tablet    Sig: Take 1 tablet (500 mg total) by mouth daily with breakfast.    Dispense:  90 tablet    Refill:  1  . metFORMIN (GLUCOPHAGE XR) 500 MG 24 hr tablet    Sig: Take 1 tablet (500 mg total) by mouth daily with breakfast.    Dispense:  90 tablet    Refill:  1    Return in about 6 weeks (around 04/21/2020) for Anxiety.    This visit occurred during the SARS-CoV-2 public health emergency.  Safety protocols were in place, including screening questions prior to the visit, additional usage of staff PPE, and extensive cleaning of exam room while observing appropriate contact time as indicated for disinfecting solutions.

## 2020-03-10 NOTE — Patient Instructions (Signed)
Great to see you today! If you feel like you need to increase the sertraline you can increase this to 50mg .  Let's try adding metformin back on.   Follow up with me in about 6 weeks.    Injectable weight loss-Wegovy (weekly), Saxenda (daily)

## 2020-03-23 ENCOUNTER — Other Ambulatory Visit: Payer: Self-pay | Admitting: Family Medicine

## 2020-03-23 MED ORDER — SERTRALINE HCL 50 MG PO TABS: ORAL_TABLET | ORAL | 3 refills | Status: DC

## 2020-03-25 ENCOUNTER — Telehealth: Payer: BC Managed Care – PPO | Admitting: Family Medicine

## 2020-04-03 ENCOUNTER — Telehealth: Payer: BC Managed Care – PPO | Admitting: Family Medicine

## 2020-04-21 ENCOUNTER — Other Ambulatory Visit: Payer: Self-pay

## 2020-04-21 ENCOUNTER — Encounter: Payer: Self-pay | Admitting: Family Medicine

## 2020-04-21 ENCOUNTER — Encounter: Payer: Self-pay | Admitting: Obstetrics and Gynecology

## 2020-04-21 ENCOUNTER — Other Ambulatory Visit (HOSPITAL_COMMUNITY)
Admission: RE | Admit: 2020-04-21 | Discharge: 2020-04-21 | Disposition: A | Discharge status: Home / Self Care | Payer: BC Managed Care – PPO | Source: Ambulatory Visit | Attending: Obstetrics and Gynecology | Admitting: Obstetrics and Gynecology

## 2020-04-21 ENCOUNTER — Ambulatory Visit (INDEPENDENT_AMBULATORY_CARE_PROVIDER_SITE_OTHER): Payer: BC Managed Care – PPO | Admitting: Obstetrics and Gynecology

## 2020-04-21 ENCOUNTER — Ambulatory Visit (INDEPENDENT_AMBULATORY_CARE_PROVIDER_SITE_OTHER): Payer: BC Managed Care – PPO | Admitting: Family Medicine

## 2020-04-21 VITALS — BP 125/85 | HR 84 | Resp 16 | Ht 63.0 in | Wt 190.0 lb

## 2020-04-21 DIAGNOSIS — Z124 Encounter for screening for malignant neoplasm of cervix: Secondary | ICD-10-CM

## 2020-04-21 DIAGNOSIS — F418 Other specified anxiety disorders: Secondary | ICD-10-CM

## 2020-04-21 DIAGNOSIS — Z113 Encounter for screening for infections with a predominantly sexual mode of transmission: Secondary | ICD-10-CM

## 2020-04-21 DIAGNOSIS — E8881 Metabolic syndrome: Secondary | ICD-10-CM

## 2020-04-21 DIAGNOSIS — E282 Polycystic ovarian syndrome: Secondary | ICD-10-CM

## 2020-04-21 DIAGNOSIS — Z01419 Encounter for gynecological examination (general) (routine) without abnormal findings: Secondary | ICD-10-CM | POA: Diagnosis present

## 2020-04-21 NOTE — Progress Notes (Signed)
Dr Ashley Royalty would like pt evaluated for PCOS

## 2020-04-21 NOTE — Patient Instructions (Signed)
Great to see you! Continue current medications.  See me again in about 3-4 months.  

## 2020-04-21 NOTE — Assessment & Plan Note (Signed)
She is having [place] and additional labs for evaluation of PCOS as ordered by her GYN.  Continue metformin for insulin resistance.

## 2020-04-21 NOTE — Patient Instructions (Signed)
Use the following websites (and others) to help learn more about your contraception options and find the method that is right for you!  - The Centers for Disease Control (CDC) website: https://www.cdc.gov/reproductivehealth/contraception/index.htm  - [facility] website: https://www.plannedparenthood.org/learn/birth-control  - [employer]org: https://www.bedsider.org/methods  

## 2020-04-21 NOTE — Assessment & Plan Note (Signed)
She is doing well with sertraline.  Will continue at current strength.

## 2020-04-21 NOTE — Progress Notes (Signed)
GYNECOLOGY ANNUAL PREVENTATIVE CARE ENCOUNTER NOTE  Subjective:   Rhonda Hubbard is a 25 y.o. G0 female here for a annual gynecologic exam. Current complaints: needs pap, also to be checked for PCOS due to weight issues and insulin resistance.    Periods are every 32 days, lasting 5 days, heavy for one. Does not have bad cramping, PMS. Periods are fine.   Denies abnormal vaginal bleeding, discharge, pelvic pain, problems with intercourse or other gynecologic concerns. Accepts STI screen.   Gynecologic History Patient's last menstrual period was 04/08/2020. Contraception: none, declines Last Pap: n/a Last mammogram: n/a Gardisil: has received  Obstetric History OB History  Gravida Para Term Preterm AB Living  0 0 0 0 0 0  SAB IAB Ectopic Multiple Live Births  0 0 0 0 0    Past Medical History:  Diagnosis Date  . Anxiety   . Insulin resistance     History reviewed. No pertinent surgical history.  Current Outpatient Medications on File Prior to Visit  Medication Sig Dispense Refill  . LORazepam (ATIVAN) 0.5 MG tablet Take 0.5-1 tablets (0.25-0.5 mg total) by mouth 2 (two) times daily as needed for anxiety. 15 tablet 0  . metFORMIN (GLUCOPHAGE XR) 500 MG 24 hr tablet Take 1 tablet (500 mg total) by mouth daily with breakfast. 90 tablet 1  . sertraline (ZOLOFT) 50 MG tablet Take 50mg  PO daily. 30 tablet 3   No current facility-administered medications on file prior to visit.    No Known Allergies  Social History   Socioeconomic History  . Marital status: Single    Spouse name: Not on file  . Number of children: Not on file  . Years of education: Not on file  . Highest education level: Not on file  Occupational History  . Not on file  Tobacco Use  . Smoking status: Never Smoker  . Smokeless tobacco: Never Used  Vaping Use  . Vaping Use: Never used  Substance and Sexual Activity  . Alcohol use: Yes    Alcohol/week: 1.0 - 2.0 standard drink    Types: 1 - 2  Standard drinks or equivalent per week  . Drug use: Never  . Sexual activity: Not Currently    Partners: Male    Birth control/protection: Abstinence, None  Other Topics Concern  . Not on file  Social History Narrative  . Not on file   Social Determinants of Health   Financial Resource Strain: Not on file  Food Insecurity: Not on file  Transportation Needs: Not on file  Physical Activity: Not on file  Stress: Not on file  Social Connections: Not on file  Intimate Partner Violence: Not on file    Family History  Problem Relation Age of Onset  . Hypertension Father   . Stroke Father   . Breast cancer Maternal Aunt     The following portions of the patient's history were reviewed and updated as appropriate: allergies, current medications, past family history, past medical history, past social history, past surgical history and problem list.  Review of Systems Pertinent items are noted in HPI.   Objective:  BP 125/85   Pulse 84   Resp 16   Ht 5\' 3"  (1.6 m)   Wt 190 lb (86.2 kg)   LMP 04/08/2020   BMI 33.66 kg/m  CONSTITUTIONAL: Well-developed, well-nourished female in no acute distress.  HENT:  Normocephalic, atraumatic, External right and left ear normal. Oropharynx is clear and moist EYES: Conjunctivae and EOM are  normal. Pupils are equal, round, and reactive to light. No scleral icterus.  NECK: Normal range of motion, supple, no masses.  Normal thyroid.  SKIN: Skin is warm and dry. No rash noted. Not diaphoretic. No erythema. No pallor. NEUROLOGIC: Alert and oriented to person, place, and time. Normal reflexes, muscle tone coordination. No cranial nerve deficit noted. PSYCHIATRIC: Normal mood and affect. Normal behavior. Normal judgment and thought content. CARDIOVASCULAR: Normal heart rate noted RESPIRATORY: Effort normal, no problems with respiration noted. BREASTS: Symmetric in size. No masses, skin changes, nipple drainage, or lymphadenopathy. ABDOMEN: Soft, no  distention noted.  No tenderness, rebound or guarding.  PELVIC: Normal appearing external genitalia; normal appearing vaginal mucosa and cervix.  No abnormal discharge noted.  Pap smear obtained. Pelvic cultures obtained. Normal uterine size, no other palpable masses, no uterine or adnexal tenderness. MUSCULOSKELETAL: Normal range of motion. No tenderness.  No cyanosis, clubbing, or edema.  2+ distal pulses.  Exam done with chaperone present.  Assessment and Plan:   1. Well woman exam with routine gynecological exam - healthy female exam - Cytology - PAP( [facility]) - Cervicovaginal ancillary only( [place])  2. Insulin resistance Denies hirsutism - will obtain US - Luteinizing hormone - Follicle stimulating hormone - US PELVIC COMPLETE WITH TRANSVAGINAL; Future  3. Routine screening for STI (sexually transmitted infection) Swabs and blood wokr  4. Cervical cancer screening Pap today   Will follow up results of pap smear/STI screen and manage accordingly. Encouraged improvement in diet and exercise.  Accepts STI screen. COVID vaccine UTD Mammogram n/a Referral for colonoscopy n/a Flu vaccine y Gardisil n/a  Routine preventative health maintenance measures emphasized. Please refer to After Visit Summary for other counseling recommendations.    Baldemar Lenis, MD, [facility] Attending Center for [employer] [facility])

## 2020-04-21 NOTE — Progress Notes (Signed)
Rhonda Hubbard - 25 y.o. female MRN [id number]  Date of birth: 1995/03/06  Subjective Chief Complaint  Patient presents with  . Anxiety    HPI Rhonda Hubbard is a 25 y.o. female here today for follow up of anxiety and PCOS.    Anxiety currently managed with sertraline.  She is doing very well with this.  She is not having any side effects related to medication at this time.   She continues on metformin for treatment of insulin resistance.  She was seen by GYN today and having [place] for evaluation of PCOS.  Reports prior dx of PCOS from another provider.   Depression screen [facility] 2/9 04/21/2020 03/10/2020 02/19/2020 01/28/2020  Decreased Interest 0 0 1 1  Down, Depressed, Hopeless 0 0 1 1  PHQ - 2 Score 0 0 2 2  Altered sleeping 1 0 0 2  Tired, decreased energy 0 0 0 3  Change in appetite 0 0 0 2  Feeling bad or failure about yourself  0 0 0 2  Trouble concentrating 0 0 0 1  Moving slowly or fidgety/restless 0 0 0 0  Suicidal thoughts 0 - 0 0  PHQ-9 Score 1 0 2 12  Difficult doing work/chores Not difficult at all Not difficult at all Somewhat difficult Somewhat difficult   GAD 7 : Generalized Anxiety Score 04/21/2020 03/10/2020 02/19/2020 01/28/2020  Nervous, Anxious, on Edge 0 0 1 2  Control/stop worrying 0 0 1 2  Worry too much - different things 0 0 0 2  Trouble relaxing 0 0 1 2  Restless 0 0 0 1  Easily annoyed or irritable 0 0 0 2  Afraid - awful might happen 0 0 0 0  Total GAD 7 Score 0 0 3 11  Anxiety Difficulty Not difficult at all Not difficult at all Somewhat difficult -      ROS:  A comprehensive ROS was completed and negative except as noted per HPI  No Known Allergies  Past Medical History:  Diagnosis Date  . Anxiety   . Insulin resistance     History reviewed. No pertinent surgical history.  Social History   Socioeconomic History  . Marital status: Single    Spouse name: Not on file  . Number of children: Not on file  . Years of education: Not on file  .  Highest education level: Not on file  Occupational History  . Not on file  Tobacco Use  . Smoking status: Never Smoker  . Smokeless tobacco: Never Used  Vaping Use  . Vaping Use: Never used  Substance and Sexual Activity  . Alcohol use: Yes    Alcohol/week: 1.0 - 2.0 standard drink    Types: 1 - 2 Standard drinks or equivalent per week  . Drug use: Never  . Sexual activity: Not Currently    Partners: Male    Birth control/protection: Abstinence, None  Other Topics Concern  . Not on file  Social History Narrative  . Not on file   Social Determinants of Health   Financial Resource Strain: Not on file  Food Insecurity: Not on file  Transportation Needs: Not on file  Physical Activity: Not on file  Stress: Not on file  Social Connections: Not on file    Family History  Problem Relation Age of Onset  . Hypertension Father   . Stroke Father   . Breast cancer Maternal Aunt     Health Maintenance  Topic Date Due  . Hepatitis C Screening  Never done  . HIV Screening  Never done  . PAP-Cervical Cytology Screening  Never done  . PAP SMEAR-Modifier  Never done  . INFLUENZA VACCINE  05/29/2020 (Originally 09/30/2019)  . TETANUS/TDAP  02/18/2030  . COVID-19 Vaccine  Completed     ----------------------------------------------------------------------------------------------------------------------------------------------------------------------------------------------------------------- Physical Exam BP 132/67 (BP Location: Left Arm, Patient Position: Sitting, Cuff Size: Normal)   Pulse 85   Wt 190 lb 8 oz (86.4 kg)   LMP 04/08/2020   SpO2 99%   BMI 33.75 kg/m   Physical Exam Constitutional:      Appearance: Normal appearance.  HENT:     Head: Normocephalic and atraumatic.  Eyes:     General: No scleral icterus. Cardiovascular:     Rate and Rhythm: Normal rate and regular rhythm.  Pulmonary:     Effort: Pulmonary effort is normal.     Breath sounds: Normal breath  sounds.  Musculoskeletal:     Cervical back: Neck supple.  Neurological:     General: No focal deficit present.     Mental Status: She is alert.  Psychiatric:        Mood and Affect: Mood normal.        Behavior: Behavior normal.     ------------------------------------------------------------------------------------------------------------------------------------------------------------------------------------------------------------------- Assessment and Plan  PCOS (polycystic ovarian syndrome) She is having [place] and additional labs for evaluation of PCOS as ordered by her GYN.  Continue metformin for insulin resistance.   Depression with anxiety She is doing well with sertraline.  Will continue at current strength.     No orders of the defined types were placed in this encounter.   Return in about 3 months (around 07/19/2020) for f/u anxiety.    This visit occurred during the SARS-CoV-2 public health emergency.  Safety protocols were in place, including screening questions prior to the visit, additional usage of staff PPE, and extensive cleaning of exam room while observing appropriate contact time as indicated for disinfecting solutions.

## 2020-04-22 LAB — LUTEINIZING HORMONE: LH: 7.5 m[IU]/mL

## 2020-04-22 LAB — CYTOLOGY - PAP: Diagnosis: NEGATIVE

## 2020-04-22 LAB — HIV ANTIBODY (ROUTINE TESTING W REFLEX): HIV 1&2 Ab, 4th Generation: NONREACTIVE

## 2020-04-22 LAB — FOLLICLE STIMULATING HORMONE: FSH: 4 m[IU]/mL

## 2020-04-23 LAB — CERVICOVAGINAL ANCILLARY ONLY
Bacterial Vaginitis (gardnerella): NEGATIVE
Candida Glabrata: NEGATIVE
Candida Vaginitis: NEGATIVE
Chlamydia: NEGATIVE
Comment: NEGATIVE
Comment: NEGATIVE
Comment: NEGATIVE
Comment: NEGATIVE
Comment: NEGATIVE
Comment: NORMAL
Neisseria Gonorrhea: NEGATIVE
Trichomonas: NEGATIVE

## 2020-04-24 ENCOUNTER — Ambulatory Visit (INDEPENDENT_AMBULATORY_CARE_PROVIDER_SITE_OTHER): Payer: BC Managed Care – PPO

## 2020-04-24 ENCOUNTER — Other Ambulatory Visit: Payer: Self-pay

## 2020-04-24 DIAGNOSIS — E8881 Metabolic syndrome: Secondary | ICD-10-CM | POA: Diagnosis not present

## 2020-05-21 ENCOUNTER — Encounter: Payer: Self-pay | Admitting: Family Medicine

## 2020-05-21 NOTE — Telephone Encounter (Signed)
She is referring to new coupon for [facility].  Nothing yet that I am aware of.

## 2020-06-18 ENCOUNTER — Encounter: Payer: Self-pay | Admitting: Family Medicine

## 2020-07-21 ENCOUNTER — Ambulatory Visit: Payer: BC Managed Care – PPO | Admitting: Family Medicine

## 2020-08-02 ENCOUNTER — Other Ambulatory Visit: Payer: Self-pay | Admitting: Family Medicine

## 2020-08-07 ENCOUNTER — Ambulatory Visit (INDEPENDENT_AMBULATORY_CARE_PROVIDER_SITE_OTHER): Payer: BC Managed Care – PPO | Admitting: Family Medicine

## 2020-08-07 ENCOUNTER — Encounter: Payer: Self-pay | Admitting: Family Medicine

## 2020-08-07 ENCOUNTER — Other Ambulatory Visit: Payer: Self-pay

## 2020-08-07 VITALS — BP 122/82 | HR 97 | Ht 63.0 in | Wt 182.0 lb

## 2020-08-07 DIAGNOSIS — F418 Other specified anxiety disorders: Secondary | ICD-10-CM

## 2020-08-07 DIAGNOSIS — R400 Somnolence: Secondary | ICD-10-CM

## 2020-08-07 DIAGNOSIS — E282 Polycystic ovarian syndrome: Secondary | ICD-10-CM | POA: Diagnosis not present

## 2020-08-07 MED ORDER — SERTRALINE HCL 50 MG PO TABS
ORAL_TABLET | ORAL | 1 refills | Status: DC
Start: 2020-08-07 — End: 2020-08-07

## 2020-08-07 MED ORDER — SERTRALINE HCL 50 MG PO TABS: ORAL_TABLET | ORAL | 1 refills | Status: DC

## 2020-08-07 MED ORDER — METFORMIN HCL ER 500 MG PO TB24: 1000.0000 mg | ORAL_TABLET | Freq: Every day | ORAL | 1 refills | Status: DC

## 2020-08-07 NOTE — Assessment & Plan Note (Signed)
Discussed possibility of OSA contributing.  Referral to sleep specialist for evaluation and consideration of sleep study.

## 2020-08-07 NOTE — Patient Instructions (Signed)
Great to see you! Let's continue current medications with increase in metformin as discussed.   Let's follow up in 6 months.

## 2020-08-07 NOTE — Assessment & Plan Note (Signed)
She will continue metformin. She felt better at 1000mg /daily previously.  This was increased today.

## 2020-08-07 NOTE — Progress Notes (Signed)
Rhonda Hubbard - 25 y.o. female MRN [id number]  Date of birth: 1995/11/23  Subjective Chief Complaint  Patient presents with   Anxiety    HPI Rhonda Hubbard is a 25 y.o. female here today for follow up of anxiety.  She reports that she is doing well with sertraline.  She has not noted any significant side effects related to medication.  She would like to continue at current strength.  She has not needed ativan.   She does report continued fatigue.  She is taking metformin for insulin resistance related to PCOS.  She reports that she is fatigued in the morning.  She has never been told that she snores.  She does have some night time awakenings.    ROS:  A comprehensive ROS was completed and negative except as noted per HPI  No Known Allergies  Past Medical History:  Diagnosis Date   Anxiety    Insulin resistance     History reviewed. No pertinent surgical history.  Social History   Socioeconomic History   Marital status: Single    Spouse name: Not on file   Number of children: Not on file   Years of education: Not on file   Highest education level: Not on file  Occupational History   Not on file  Tobacco Use   Smoking status: Never   Smokeless tobacco: Never  Vaping Use   Vaping Use: Never used  Substance and Sexual Activity   Alcohol use: Yes    Alcohol/week: 1.0 - 2.0 standard drink    Types: 1 - 2 Standard drinks or equivalent per week   Drug use: Never   Sexual activity: Not Currently    Partners: Male    Birth control/protection: Abstinence, None  Other Topics Concern   Not on file  Social History Narrative   Not on file   Social Determinants of Health   Financial Resource Strain: Not on file  Food Insecurity: Not on file  Transportation Needs: Not on file  Physical Activity: Not on file  Stress: Not on file  Social Connections: Not on file    Family History  Problem Relation Age of Onset   Hypertension Father    Stroke Father    Breast cancer  Maternal Aunt     Health Maintenance  Topic Date Due   Hepatitis C Screening  Never done   INFLUENZA VACCINE  09/29/2020   PAP-Cervical Cytology Screening  04/22/2023   PAP SMEAR-Modifier  04/22/2023   TETANUS/TDAP  02/18/2030   Zoster Vaccines- Shingrix (1 of 2) 11/08/2046   HPV VACCINES  Completed   COVID-19 Vaccine  Completed   HIV Screening  Completed   Pneumococcal Vaccine 44-20 Years old  Aged Out     ----------------------------------------------------------------------------------------------------------------------------------------------------------------------------------------------------------------- Physical Exam BP 122/82 (BP Location: Left Arm, Patient Position: Sitting, Cuff Size: Large)   Pulse 97   Ht 5\' 3"  (1.6 m)   Wt 182 lb (82.6 kg)   SpO2 97%   BMI 32.24 kg/m   Physical Exam Constitutional:      Appearance: Normal appearance.  HENT:     Head: Normocephalic and atraumatic.  Eyes:     General: No scleral icterus. Cardiovascular:     Rate and Rhythm: Normal rate and regular rhythm.  Pulmonary:     Effort: Pulmonary effort is normal.     Breath sounds: Normal breath sounds.  Musculoskeletal:     Cervical back: Neck supple.  Neurological:     General: No focal deficit present.  Mental Status: She is alert.  Psychiatric:        Mood and Affect: Mood normal.        Behavior: Behavior normal.    ------------------------------------------------------------------------------------------------------------------------------------------------------------------------------------------------------------------- Assessment and Plan  PCOS (polycystic ovarian syndrome) She will continue metformin. She felt better at 1000mg /daily previously.  This was increased today.   Depression with anxiety She is doing quite well with sertraline.  Will continue at current strength.    Daytime somnolence Discussed possibility of OSA contributing.  Referral to  sleep specialist for evaluation and consideration of sleep study.    Meds ordered this encounter  Medications   DISCONTD: metFORMIN (GLUCOPHAGE XR) 500 MG 24 hr tablet    Sig: Take 2 tablets (1,000 mg total) by mouth daily with breakfast.    Dispense:  180 tablet    Refill:  1   DISCONTD: sertraline (ZOLOFT) 50 MG tablet    Sig: TAKE 1 TABLET EVERY DAY    Dispense:  90 tablet    Refill:  1   metFORMIN (GLUCOPHAGE XR) 500 MG 24 hr tablet    Sig: Take 2 tablets (1,000 mg total) by mouth daily with breakfast.    Dispense:  180 tablet    Refill:  1   sertraline (ZOLOFT) 50 MG tablet    Sig: TAKE 1 TABLET EVERY DAY    Dispense:  90 tablet    Refill:  1    Return in about 6 months (around 02/06/2021) for Anxiety.    This visit occurred during the SARS-CoV-2 public health emergency.  Safety protocols were in place, including screening questions prior to the visit, additional usage of staff PPE, and extensive cleaning of exam room while observing appropriate contact time as indicated for disinfecting solutions.

## 2020-08-07 NOTE — Assessment & Plan Note (Signed)
She is doing quite well with sertraline.  Will continue at current strength.

## 2020-10-14 ENCOUNTER — Encounter: Payer: Self-pay | Admitting: Neurology

## 2020-10-14 ENCOUNTER — Ambulatory Visit: Payer: BC Managed Care – PPO | Admitting: Neurology

## 2020-10-14 VITALS — BP 124/91 | HR 70 | Ht 63.0 in | Wt 191.5 lb

## 2020-10-14 DIAGNOSIS — G478 Other sleep disorders: Secondary | ICD-10-CM | POA: Diagnosis not present

## 2020-10-14 DIAGNOSIS — R400 Somnolence: Secondary | ICD-10-CM | POA: Diagnosis not present

## 2020-10-14 DIAGNOSIS — R5383 Other fatigue: Secondary | ICD-10-CM | POA: Diagnosis not present

## 2020-10-14 NOTE — Progress Notes (Signed)
SLEEP MEDICINE CLINIC    Provider:  Melvyn Novasarmen  Kanai Hilger, MD  Primary Care Physician:  Everrett CoombeMatthews, [place], DO [address] [place] [address]     Referring Provider: Everrett CoombeMatthews, [place], Do [address]  [address] [place] [address]          Chief Complaint according to patient   Patient presents with:     New Patient (Initial Visit)     Internal referral from Everrett Coombeody Matthews, MD for daytime sleepiness. Tired no matter how much sleep she gets.  This has been going on for about 5 years when she was in college. 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 Thinks this also contributes to fatigue      HISTORY OF PRESENT ILLNESS:  Dannial MonarchKaitlyn Demarco is a 25 y.o. Caucasian female patient and is seen here upon referral on 10/14/2020 from Dr. Ashley RoyaltyMatthews.  for a Consultation. .  Chief concern according to patient : Mrs. 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  His she recently saw her primary care physician with a chief complaint of anxiety on 08-07-2020 and reports that she had more fatigue she was taking metformin for insulin resistance and PCOS she has not had any history of snoring she sometimes wakes during the night but is mostly worried about the nonqualitative sleep nonrestorative sleep she experiences.   I have the pleasure of seeing Dannial MonarchKaitlyn Grout today, a right-handed Caucasian female with a possible sleep disorder.  She has a past medical history of Anxiety, and Insulin resistance. She was cleared of he diagnosis of PCOS.   Sleep relevant medical history: none.    Family medical /sleep history: Father with hypersomnia, MGM on CPAP,. father with HTN and stroke.    Social history:  Patient is working as a Education officer, museummiddle school teacher -lives with  domestic partner,  and her dog. Pets are present. Tobacco use; /.  ETOH use seldomly,  Caffeine intake in form of ( 1/ day) Soda. Regular exercise none .   Hobbies  :tennis      Sleep habits are as follows: The patient's dinner time is between 8.30 PM. The patient goes to bed at 10 PM and continues to sleep for intervals of 3-4 hours, wakes for one bathroom break.   The preferred sleep position is supine and on each side, with the support of one pillows.  Dreams are reportedly frequent/vivid.  6  AM is the usual rise time. The patient wakes up with an alarm. She does not snooze.  She reports not feeling refreshed or restored in AM, and residual fatigue. Naps are taken frequently, lasting from 2-3 hours   and are not interfering with nocturnal sleep. Sleep is an Escape   Review of Systems: Out of a complete 14 system review, the patient complains of only the following symptoms, and all other reviewed systems are negative.:  Fatigue, sleepiness , hypersomnia.  ANXIETY.  How likely are you to doze in the following situations: 0 = not likely, 1 = slight chance, 2 = moderate chance, 3 = high chance   Sitting and Reading? Watching Television? Sitting inactive in a public place (theater or meeting)? As a passenger in a car for an hour without a break? Lying down in the afternoon when circumstances permit? Sitting and talking to someone? Sitting quietly after lunch without alcohol? In a car, while stopped for a few minutes in  traffic?   Total = 7 / 24 points   FSS endorsed at  45/ 63 points.   Social History   Socioeconomic History   Marital status: Single    Spouse name: Not on file   Number of children: 0   Years of education: BA   Highest education level: Not on file  Occupational History   Not on file  Tobacco Use   Smoking status: Never   Smokeless tobacco: Never  Vaping Use   Vaping Use: Never used  Substance and Sexual Activity   Alcohol use: Yes    Alcohol/week: 1.0 - 2.0 standard drink    Types: 1 - 2 Standard drinks or equivalent per week    Comment: 1 per month   Drug use: Never   Sexual activity: Not Currently    Partners:  Male    Birth control/protection: Abstinence, None  Other Topics Concern   Not on file  Social History Narrative   Lives w/ domestic partner/boyfriend   Caffeine use: 1 per day (soda)   Social Determinants of Health   Financial Resource Strain: Not on file  Food Insecurity: Not on file  Transportation Needs: Not on file  Physical Activity: Not on file  Stress: Not on file  Social Connections: Not on file    Family History  Problem Relation Age of Onset   Hypertension Father    Stroke Father    Breast cancer Maternal Aunt     Past Medical History:  Diagnosis Date   Anxiety    Insulin resistance     No past surgical history on file.   Current Outpatient Medications on File Prior to Visit  Medication Sig Dispense Refill   LORazepam (ATIVAN) 0.5 MG tablet Take 0.5-1 tablets (0.25-0.5 mg total) by mouth 2 (two) times daily as needed for anxiety. 15 tablet 0   metFORMIN (GLUCOPHAGE XR) 500 MG 24 hr tablet Take 2 tablets (1,000 mg total) by mouth daily with breakfast. 180 tablet 1   sertraline (ZOLOFT) 50 MG tablet TAKE 1 TABLET EVERY DAY 90 tablet 1   No current facility-administered medications on file prior to visit.    No Known Allergies  Physical exam:  Today's Vitals   10/14/20 0827  BP: (!) 124/91  Pulse: 70  SpO2: 99%  Weight: 191 lb 8 oz (86.9 kg)  Height: 5\' 3"  (1.6 m)   Body mass index is 33.92 kg/m.   Wt Readings from Last 3 Encounters:  10/14/20 191 lb 8 oz (86.9 kg)  08/07/20 182 lb (82.6 kg)  04/21/20 190 lb 8 oz (86.4 kg)     Ht Readings from Last 3 Encounters:  10/14/20 5\' 3"  (1.6 m)  08/07/20 5\' 3"  (1.6 m)  04/21/20 5\' 3"  (1.6 m)      General: The patient is awake, alert and appears not in acute distress. The patient is well groomed. Head: Normocephalic, atraumatic. Neck is supple. Mallampati 3,  neck circumference:16.5  inches . Nasal airflow  patent.  Retrognathia is  seen.  Dental status: wore brace , had teeth removed,   Cardiovascular:  Regular rate and cardiac rhythm by pulse,  without distended neck veins. Respiratory: Lungs are clear to auscultation.  Skin:  Without evidence of ankle edema, or rash. Trunk: The patient's posture is erect.   Neurologic exam : The patient is awake and alert, oriented to place and time.   Memory subjective described as intact.  Attention span & concentration ability appears normal.  Speech is  fluent,  without  dysarthria, dysphonia or aphasia.  Mood and affect are appropriate.   Cranial nerves: no loss of smell or taste reported  Pupils are equal and briskly reactive to light. Funduscopic exam . Pupils are large - 5 mm Extraocular movements in vertical and horizontal planes were intact and without nystagmus. No Diplopia. Visual fields by finger perimetry are intact. Hearing was intact.   Facial sensation intact to fine touch.  Facial motor strength is symmetric and tongue and uvula move midline.  Neck ROM : rotation, tilt and flexion extension were normal for age and shoulder shrug was symmetrical.    Motor exam:  Symmetric bulk, tone and ROM.   Normal tone without cog -wheeling, symmetric grip strength .   Sensory:  Fine touch, pinprick and vibration were tested  and  normal.  Proprioception tested in the upper extremities was normal.   Coordination: Rapid alternating movements in the fingers/hands were of normal speed.     Gait and station: Patient could rise unassisted from a seated position, walked without assistive device.  Stance is of normal width/ base and the patient turned with 3 steps.  Toe and heel walk were deferred.  Deep tendon reflexes: in the upper and lower extremities are symmetric and intact.  Babinski response was deferred.       After spending a total time of  35  minutes face to face and additional time for physical and neurologic examination, review of laboratory studies,  personal review of imaging studies, reports and results of  other testing and review of referral information / records as far as provided in visit, I have established the following assessments:  1) I have the pleasure of meeting Mrs. Swift today who presents with a 5-year history of anxiety and increased fatigue.  Some of this may have manifested in form of nonrestorative nonrefreshing sleep.   She likes to go to bed also to deal with certain stressors or to escape them.  The overall sleep time at night is not reduced but no matter how many hours she may sleep she does feel its not enough or it is not refreshing and off.  There has been no history of snoring or witnessed apneas.   She does have a very narrow upper airway with a Mallampati grade 3 which could indicate that she has a tendency to snore.  There is no obstruction of the nasal airflow and no history of recurrent sinus disease or other congestive airway problems.  She ha elevated H-globin levels 01-2020, normal TSH, B 12, vit D was not covered by insurance.   I think we will order a home sleep test to rule out that there is any organic or physiologic problems with her sleep.  She does dream and she has been on sertraline which tends to push REM sleep out into the morning hours.  But she recalls sometimes having dreams that feel the stress or anxious or anxiety provoking situations she does not wake frequently from an nightmarish or bad dream. Her main concern is FATIGUE.  She does not wake up with palpitations diaphoresis or any other stress related physiologic manifestation -diarrhea on metformin.       My Plan is to proceed with:  1)HST. She is less likely to sleep well in an unfamiliar environment.  I would like to thank Everrett Coombe, DO and Everrett Coombe, Do [address] [place],  [place] [address] for allowing me to meet with  and to take care of this pleasant patient.   I plan to follow up either personally or through our NP within 3 month.   CC: I will share my  notes with PCP.Marland Kitchen  Electronically signed by: Melvyn Novas, MD 10/14/2020 8:51 AM  [facility] Board certified by The [employer] of Sleep Medicine and Diplomate of the [employer] of Sleep Medicine. Board certified In Neurology through the ABPN, Fellow of the [employer] of Neurology. Medical Director of [facility].

## 2020-10-27 ENCOUNTER — Telehealth: Payer: Self-pay

## 2020-10-27 NOTE — Telephone Encounter (Signed)
LVM for pt to call me back to schedule sleep study  

## 2020-11-05 ENCOUNTER — Encounter: Payer: Self-pay | Admitting: Family Medicine

## 2020-11-05 ENCOUNTER — Other Ambulatory Visit: Payer: Self-pay | Admitting: Family Medicine

## 2020-11-05 DIAGNOSIS — F418 Other specified anxiety disorders: Secondary | ICD-10-CM

## 2020-11-05 MED ORDER — SERTRALINE HCL 100 MG PO TABS: ORAL_TABLET | ORAL | 1 refills | Status: DC

## 2020-12-01 ENCOUNTER — Ambulatory Visit (INDEPENDENT_AMBULATORY_CARE_PROVIDER_SITE_OTHER): Payer: BC Managed Care – PPO | Admitting: Neurology

## 2020-12-01 DIAGNOSIS — G478 Other sleep disorders: Secondary | ICD-10-CM

## 2020-12-01 DIAGNOSIS — R5383 Other fatigue: Secondary | ICD-10-CM

## 2020-12-01 DIAGNOSIS — R400 Somnolence: Secondary | ICD-10-CM

## 2020-12-01 DIAGNOSIS — G4710 Hypersomnia, unspecified: Secondary | ICD-10-CM | POA: Diagnosis not present

## 2020-12-11 NOTE — Progress Notes (Signed)
      [facility]   HOME SLEEP TEST REPORT ( by Watch PAT)   STUDY DATA loaded 12-11-2020   DOB: 11-07-2019      ORDERING CLINICIAN: Melvyn Novas, MD  REFERRING CLINICIAN: referral on 10/14/2020 from Dr. Ashley Royalty.  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  she recently saw her primary care physician with a chief complaint of anxiety on 08-07-2020 and reports that she had more fatigue, she was taking metformin for insulin resistance and PCOS, she has not had any history of snoring. She sometimes wakes during the night but is mostly worried about the nonqualitative sleep nonrestorative sleep she experiences.      Epworth sleepiness score: 7/24.   BMI: 34 kg/m   Neck Circumference: 17"   FINDINGS:   Sleep Summary:   Total Recording Time for this home sleep test amounted to 8 hours 17 minutes.  The total sleep time was calculated at 7 hours and 18 minutes.            Percent REM (%): REM sleep amounted to 17.4% of total sleep time.                                           Respiratory Indices:   Calculated pAHI (per hour):   The overall apnea hypopnea index was 1.1 for the full night.  During REM sleep the AHI was 4.0/h and in non-REM sleep 0.5/h.   Positional data : AHI in supine of 1.4/h and a nonsupine sleep position of 0.4/h  Snoring level was mild with a mean volume of 40 dB.  Snoring was present during 15% of the total sleep time.                                                                   Oxygen Saturation Statistics:   Oxygen saturation varied between a nadir of 92% and a maximum of 98% with a mean saturation at 96%.   O2 Saturation (minutes) <89%:   0 minutes.        Pulse Rate Statistics:             Pulse Range:    Heart rate varied between 43 bpm and 105 bpm with a mean heart rate of 60 bpm.  Please note that this device cannot establish  cardiac rhythm only cardiac rate.             IMPRESSION:  This HST did not detect any sleep apnea evidence, nor hypoxemia nor cardiac rate abnormalities.  Snoring was mild.  The duration of sleep was normal , not highly fragmented.   I do not see any physiologic evidence of a sleep disorder.   RECOMMENDATION: Follow-up with a sleep clinic is not necessary.    INTERPRETING PHYSICIAN:Carmen Dohmeier, MD   Medical Director of [facility].

## 2020-12-17 DIAGNOSIS — G478 Other sleep disorders: Secondary | ICD-10-CM | POA: Insufficient documentation

## 2020-12-17 NOTE — Procedures (Signed)
[facility]   HOME SLEEP TEST REPORT ( by Watch PAT)   STUDY DATA loaded 12-11-2020   DOB: 11-07-2019      ORDERING CLINICIAN: Melvyn Novas, MD  REFERRING CLINICIAN: referral on 10/14/2020 from Dr. Ashley Royalty.  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  she recently saw her primary care physician with a chief complaint of anxiety on 08-07-2020 and reports that she had more fatigue, she was taking metformin for insulin resistance and PCOS, she has not had any history of snoring. She sometimes wakes during the night but is mostly worried about the nonqualitative sleep nonrestorative sleep she experiences.      Epworth sleepiness score: 7/24.   BMI: 34 kg/m   Neck Circumference: 17"   FINDINGS:   Sleep Summary:   Total Recording Time for this home sleep test amounted to 8 hours 17 minutes.  The total sleep time was calculated at 7 hours and 18 minutes.            Percent REM (%): REM sleep amounted to 17.4% of total sleep time.                                           Respiratory Indices:   Calculated pAHI (per hour):   The overall apnea hypopnea index was 1.1 for the full night.  During REM sleep the AHI was 4.0/h and in non-REM sleep 0.5/h.   Positional data : AHI in supine of 1.4/h and a nonsupine sleep position of 0.4/h  Snoring level was mild with a mean volume of 40 dB.  Snoring was present during 15% of the total sleep time.                                                                   Oxygen Saturation Statistics:   Oxygen saturation varied between a nadir of 92% and a maximum of 98% with a mean saturation at 96%.   O2 Saturation (minutes) <89%:   0 minutes.        Pulse Rate Statistics:             Pulse Range:    Heart rate varied between 43 bpm and 105 bpm with a mean heart rate of 60 bpm.  Please note that this device cannot establish cardiac  rhythm only cardiac rate.             IMPRESSION:  This HST did not detect any sleep apnea evidence, nor hypoxemia nor cardiac rate abnormalities.  Snoring was mild.  The duration of sleep was normal , not highly fragmented.   I do not see any physiologic evidence of a sleep disorder.   RECOMMENDATION: Follow-up with a sleep clinic is not necessary.    INTERPRETING PHYSICIAN:Adely Facer, MD   Medical Director of [facility].

## 2020-12-17 NOTE — Progress Notes (Signed)
IMPRESSION:  This HST did not detect any sleep apnea evidence, nor hypoxemia nor cardiac rate abnormalities.  Snoring was mild.  The duration of sleep was normal ,sleep was not highly fragmented.   I do not see any physiologic evidence of a sleep disorder.  RECOMMENDATION: Follow-up with a sleep clinic is not necessary.

## 2021-02-05 ENCOUNTER — Ambulatory Visit: Payer: BC Managed Care – PPO | Admitting: Family Medicine

## 2021-02-18 ENCOUNTER — Telehealth (INDEPENDENT_AMBULATORY_CARE_PROVIDER_SITE_OTHER): Payer: BC Managed Care – PPO | Admitting: Family Medicine

## 2021-02-18 ENCOUNTER — Encounter: Payer: Self-pay | Admitting: Family Medicine

## 2021-02-18 VITALS — Ht 63.0 in | Wt 190.0 lb

## 2021-02-18 DIAGNOSIS — Z13220 Encounter for screening for lipoid disorders: Secondary | ICD-10-CM | POA: Diagnosis not present

## 2021-02-18 DIAGNOSIS — E282 Polycystic ovarian syndrome: Secondary | ICD-10-CM | POA: Diagnosis not present

## 2021-02-18 DIAGNOSIS — R635 Abnormal weight gain: Secondary | ICD-10-CM | POA: Diagnosis not present

## 2021-02-18 NOTE — Assessment & Plan Note (Signed)
She is not tolerating metformin well.  Discussed that there really is not any alternatives to metformin at this time.  I encouraged her to continue to work on diet and exercise changes.  Updating labs at this time.  We can consider adding GLP-1 to help with weight management if insurance would cover this.

## 2021-02-18 NOTE — Progress Notes (Signed)
Rhonda Hubbard - 25 y.o. female MRN [id number]  Date of birth: Feb 17, 1996   This visit type was conducted due to national recommendations for restrictions regarding the COVID-19 Pandemic (e.g. social distancing).  This format is felt to be most appropriate for this patient at this time.  All issues noted in this document were discussed and addressed.  No physical exam was performed (except for noted visual exam findings with Video Visits).  I discussed the limitations of evaluation and management by telemedicine and the availability of in person appointments. The patient expressed understanding and agreed to proceed.  I connected withNAME@ on 02/18/21 at  2:30 PM EST by a video enabled telemedicine application and verified that I am speaking with the correct person using two identifiers.  Present at visit: Everrett Coombe, DO Dannial Monarch   Patient Location: Home [address] [place] [address]   Provider location:   [facility]  Chief Complaint  Patient presents with   Medication Problem    GI upset and diarrhea since she started the medication.     HPI  Rhonda Hubbard is a 25 y.o. female who presents via audio/video conferencing for a telehealth visit today.  She reports that she is having side effects related to metformin.  Since starting this she has had increased diarrhea.  She is curious if there are other medications that can help with insulin resistance related to PCOS.  She is trying to exercise some as well.   ROS:  A comprehensive ROS was completed and negative except as noted per HPI  Past Medical History:  Diagnosis Date   Anxiety    Insulin resistance     No past surgical history on file.  Family History  Problem Relation Age of Onset   Hypertension Father    Stroke Father    Breast cancer Maternal Aunt     Social History   Socioeconomic History   Marital status: Single    Spouse name: Not on file   Number of children: 0   Years of education: BA   Highest  education level: Not on file  Occupational History   Not on file  Tobacco Use   Smoking status: Never   Smokeless tobacco: Never  Vaping Use   Vaping Use: Never used  Substance and Sexual Activity   Alcohol use: Yes    Alcohol/week: 1.0 - 2.0 standard drink    Types: 1 - 2 Standard drinks or equivalent per week    Comment: 1 per month   Drug use: Never   Sexual activity: Not Currently    Partners: Male    Birth control/protection: Abstinence, None  Other Topics Concern   Not on file  Social History Narrative   Lives w/ domestic partner/boyfriend   Caffeine use: 1 per day (soda)   Social Determinants of Health   Financial Resource Strain: Not on file  Food Insecurity: Not on file  Transportation Needs: Not on file  Physical Activity: Not on file  Stress: Not on file  Social Connections: Not on file  Intimate Partner Violence: Not on file     Current Outpatient Medications:    LORazepam (ATIVAN) 0.5 MG tablet, Take 0.5-1 tablets (0.25-0.5 mg total) by mouth 2 (two) times daily as needed for anxiety., Disp: 15 tablet, Rfl: 0   metFORMIN (GLUCOPHAGE XR) 500 MG 24 hr tablet, Take 2 tablets (1,000 mg total) by mouth daily with breakfast., Disp: 180 tablet, Rfl: 1   sertraline (ZOLOFT) 100 MG tablet, TAKE  1 TABLET EVERY DAY, Disp: 90 tablet, Rfl: 1  EXAM:  VITALS per patient if applicable: Ht 5\' 3"  (1.6 m)    Wt 190 lb (86.2 kg)    LMP 01/30/2021    BMI 33.66 kg/m   GENERAL: alert, oriented, appears well and in no acute distress  HEENT: atraumatic, conjunttiva clear, no obvious abnormalities on inspection of external nose and ears  NECK: normal movements of the head and neck  LUNGS: on inspection no signs of respiratory distress, breathing rate appears normal, no obvious gross SOB, gasping or wheezing  CV: no obvious cyanosis  MS: moves all visible extremities without noticeable abnormality  PSYCH/NEURO: pleasant and cooperative, no obvious depression or anxiety,  speech and thought processing grossly intact  ASSESSMENT AND PLAN:  Discussed the following assessment and plan:  PCOS (polycystic ovarian syndrome) She is not tolerating metformin well.  Discussed that there really is not any alternatives to metformin at this time.  I encouraged her to continue to work on diet and exercise changes.  Updating labs at this time.  We can consider adding GLP-1 to help with weight management if insurance would cover this.       I discussed the assessment and treatment plan with the patient. The patient was provided an opportunity to ask questions and all were answered. The patient agreed with the plan and demonstrated an understanding of the instructions.   The patient was advised to call back or seek an in-person evaluation if the symptoms worsen or if the condition fails to improve as anticipated.    14/04/2020, DO

## 2021-05-26 ENCOUNTER — Other Ambulatory Visit: Payer: Self-pay | Admitting: Family Medicine

## 2021-05-26 DIAGNOSIS — F418 Other specified anxiety disorders: Secondary | ICD-10-CM

## 2022-01-20 ENCOUNTER — Other Ambulatory Visit: Payer: Self-pay | Admitting: Family Medicine

## 2022-01-20 DIAGNOSIS — F418 Other specified anxiety disorders: Secondary | ICD-10-CM

## 2022-01-20 NOTE — Telephone Encounter (Signed)
Lvm for patient to call back to schedule an appointment for Dr. Ashley Royalty. Unable to provide refill due to medication being controlled. Hasn't been in since 2022. Tvt

## 2022-01-25 NOTE — Telephone Encounter (Signed)
Lvm for patient again to call back to schedule an appointment for Dr. Ashley Royalty. Unable to provide refill due to medication being controlled. Hasn't been in since 2022. Tvt

## 2022-01-25 NOTE — Telephone Encounter (Signed)
Requesting Front Office follow-up on pt call. Thanks

## 2022-02-01 ENCOUNTER — Encounter: Payer: Self-pay | Admitting: Family Medicine

## 2022-02-01 ENCOUNTER — Ambulatory Visit: Payer: BC Managed Care – PPO | Admitting: Family Medicine

## 2022-02-01 VITALS — BP 123/79 | HR 82 | Ht 63.0 in | Wt 191.0 lb

## 2022-02-01 DIAGNOSIS — R5383 Other fatigue: Secondary | ICD-10-CM

## 2022-02-01 DIAGNOSIS — F418 Other specified anxiety disorders: Secondary | ICD-10-CM | POA: Diagnosis not present

## 2022-02-01 DIAGNOSIS — E282 Polycystic ovarian syndrome: Secondary | ICD-10-CM

## 2022-02-01 MED ORDER — SERTRALINE HCL 100 MG PO TABS: ORAL_TABLET | ORAL | 1 refills | Status: DC

## 2022-02-01 NOTE — Progress Notes (Signed)
Rhonda Hubbard - 26 y.o. female MRN [id number]  Date of birth: [birth year]  Subjective Chief Complaint  Patient presents with   Anxiety    HPI Rhonda Hubbard is a 26 year old female here today for follow-up of anxiety.  Current treatment with sertraline 100 mg daily.  She reports this is working quite well for her.  She has not had any significant side effects at current strength.  She would like to continue with this for now.  She has been working on dietary change to help with weight management. She has reached a plateau and asked for advice to help with this.  She is exercising a few days per week through walking and doing some light resistance training.  She would like to have updated labs completed.  ROS:  A comprehensive ROS was completed and negative except as noted per HPI   No Known Allergies  Past Medical History:  Diagnosis Date   Anxiety    Insulin resistance     History reviewed. No pertinent surgical history.  Social History   Socioeconomic History   Marital status: Single    Spouse name: Not on file   Number of children: 0   Years of education: BA   Highest education level: Not on file  Occupational History   Not on file  Tobacco Use   Smoking status: Never   Smokeless tobacco: Never  Vaping Use   Vaping Use: Never used  Substance and Sexual Activity   Alcohol use: Yes    Alcohol/week: 1.0 - 2.0 standard drink of alcohol    Types: 1 - 2 Standard drinks or equivalent per week    Comment: 1 per month   Drug use: Never   Sexual activity: Not Currently    Partners: Male    Birth control/protection: Abstinence, None  Other Topics Concern   Not on file  Social History Narrative   Lives w/ domestic partner/boyfriend   Caffeine use: 1 per day (soda)   Social Determinants of Health   Financial Resource Strain: Not on file  Food Insecurity: Not on file  Transportation Needs: Not on file  Physical Activity: Not on file  Stress: Not on file  Social  Connections: Not on file    Family History  Problem Relation Age of Onset   Hypertension Father    Stroke Father    Breast cancer Maternal Aunt     Health Maintenance  Topic Date Due   Hepatitis C Screening  Never done   INFLUENZA VACCINE  Never done   COVID-19 Vaccine (4 - 2023-24 season) 10/30/2021   PAP-Cervical Cytology Screening  04/22/2023   PAP SMEAR-Modifier  04/22/2023   DTaP/Tdap/Td (9 - Td or Tdap) 02/18/2030   HPV VACCINES  Completed   HIV Screening  Completed     ----------------------------------------------------------------------------------------------------------------------------------------------------------------------------------------------------------------- Physical Exam BP 123/79 (BP Location: Left Arm, Patient Position: Sitting, Cuff Size: Normal)   Pulse 82   Ht 5\' 3"  (1.6 m)   Wt 191 lb (86.6 kg)   SpO2 99%   BMI 33.83 kg/m   Physical Exam Constitutional:      Appearance: Normal appearance.  Eyes:     General: No scleral icterus. Neurological:     General: No focal deficit present.     Mental Status: She is alert.  Psychiatric:        Mood and Affect: Mood normal.        Behavior: Behavior normal.     ------------------------------------------------------------------------------------------------------------------------------------------------------------------------------------------------------------------- Assessment and Plan  Depression with  anxiety She continues to do well with Zoloft at current strength.  Recommend continuation for now.  We will plan to follow-up in 6 months.  PCOS (polycystic ovarian syndrome) She has been making changes to her diet and exercising regularly to help with weight management.  Encouraged her to continue with changes.  Additionally we discussed adding a little more resistance training to help with increasing her BMR.   Meds ordered this encounter  Medications   sertraline (ZOLOFT) 100 MG tablet     Sig: TAKE 1 TABLET BY MOUTH EVERY DAY    Dispense:  90 tablet    Refill:  1    No follow-ups on file.    This visit occurred during the SARS-CoV-2 public health emergency.  Safety protocols were in place, including screening questions prior to the visit, additional usage of staff PPE, and extensive cleaning of exam room while observing appropriate contact time as indicated for disinfecting solutions.

## 2022-02-01 NOTE — Assessment & Plan Note (Signed)
She has been making changes to her diet and exercising regularly to help with weight management.  Encouraged her to continue with changes.  Additionally we discussed adding a little more resistance training to help with increasing her BMR.

## 2022-02-01 NOTE — Assessment & Plan Note (Signed)
She continues to do well with Zoloft at current strength.  Recommend continuation for now.  We will plan to follow-up in 6 months.

## 2022-02-05 ENCOUNTER — Encounter: Payer: Self-pay | Admitting: Emergency Medicine

## 2022-02-05 ENCOUNTER — Ambulatory Visit
Admission: EM | Admit: 2022-02-05 | Discharge: 2022-02-05 | Disposition: A | Discharge status: Home / Self Care | Payer: BC Managed Care – PPO | Attending: Family Medicine | Admitting: Family Medicine

## 2022-02-05 DIAGNOSIS — J069 Acute upper respiratory infection, unspecified: Secondary | ICD-10-CM | POA: Diagnosis not present

## 2022-02-05 DIAGNOSIS — Z20822 Contact with and (suspected) exposure to covid-19: Secondary | ICD-10-CM | POA: Insufficient documentation

## 2022-02-05 DIAGNOSIS — J029 Acute pharyngitis, unspecified: Secondary | ICD-10-CM

## 2022-02-05 LAB — RESP PANEL BY RT-PCR (FLU A&B, COVID) ARPGX2
Influenza A by PCR: NEGATIVE
Influenza B by PCR: NEGATIVE
SARS Coronavirus 2 by RT PCR: NEGATIVE

## 2022-02-05 LAB — POCT RAPID STREP A (OFFICE): Rapid Strep A Screen: NEGATIVE

## 2022-02-05 MED ORDER — ACETAMINOPHEN 325 MG PO TABS
650.0000 mg | ORAL_TABLET | Freq: Once | ORAL | Status: AC
  Administered 2022-02-05: 650 mg via ORAL

## 2022-02-05 NOTE — ED Provider Notes (Signed)
[facility]    CSN: [id number] Arrival date & time: 02/05/22  1334      History   Chief Complaint Chief Complaint  Patient presents with   Sore Throat    HPI Devinee Ting is a 26 y.o. female.   Patient complains of two to three day history of typical cold-like symptoms developing progressively, including mild sore throat, headache, fatigue, and cough.  She had fever to 100.2 yesterday, developing rhinorrhea and worsening sore throat today.  She denies pleuritic pain and shortness of breath. 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  The history is provided by the patient.    Past Medical History:  Diagnosis Date   Anxiety    Insulin resistance     Patient Active Problem List   Diagnosis Date Noted   Non-restorative sleep 12/17/2020   Fatigue 10/14/2020   Daytime somnolence 08/07/2020   PCOS (polycystic ovarian syndrome) 03/10/2020   Well adult exam 02/19/2020   Depression with anxiety 01/28/2020    History reviewed. No pertinent surgical history.  OB History     Gravida  0   Para  0   Term  0   Preterm  0   AB  0   Living  0      SAB  0   IAB  0   Ectopic  0   Multiple  0   Live Births  0            Home Medications    Prior to Admission medications   Medication Sig Start Date End Date Taking? Authorizing Provider  sertraline (ZOLOFT) 100 MG tablet TAKE 1 TABLET BY MOUTH EVERY DAY 02/01/22  Yes Luetta Nutting, DO    Family History Family History  Problem Relation Age of Onset   Hypertension Father    Stroke Father    Breast cancer Maternal Aunt     Social History Social History   Tobacco Use   Smoking status: Never   Smokeless tobacco: Never  Vaping Use   Vaping Use: Never used  Substance Use Topics   Alcohol use: Yes    Alcohol/week: 1.0 - 2.0 standard drink of alcohol    Types: 1 - 2 Standard drinks or equivalent per week    Comment: 1 per month   Drug use: Never      Allergies   Lexapro [escitalopram]   Review of Systems Review of Systems + sore throat + cough No pleuritic pain No wheezing + nasal congestion + post-nasal drainage No sinus pain/pressure No itchy/red eyes No earache No hemoptysis No SOB + fever No nausea No vomiting No abdominal pain No diarrhea No urinary symptoms No skin rash + fatigue No myalgias + headache Used OTC meds (Advil) without relief   Physical Exam Triage Vital Signs ED Triage Vitals  Enc Vitals Group     BP 02/05/22 1401 118/84     Pulse Rate 02/05/22 1401 (!) 119     Resp 02/05/22 1401 18     Temp 02/05/22 1401 98.7 F (37.1 C)     Temp Source 02/05/22 1401 Oral     SpO2 02/05/22 1401 97 %     Weight 02/05/22 1403 190 lb (86.2 kg)     Height 02/05/22 1403 5\' 3"  (1.6 m)     Head Circumference --      Peak Flow --      Pain Score 02/05/22 1402 7     Pain Loc --  Pain Edu? --      Excl. in GC? --    No data found.  Updated Vital Signs BP 118/84 (BP Location: Right Arm)   Pulse (!) 119   Temp 100.3 F (37.9 C) (Oral)   Resp 18   Ht 5\' 3"  (1.6 m)   Wt 86.2 kg   LMP 01/15/2022   SpO2 97%   BMI 33.66 kg/m   Visual Acuity Right Eye Distance:   Left Eye Distance:   Bilateral Distance:    Right Eye Near:   Left Eye Near:    Bilateral Near:     Physical Exam Nursing notes and Vital Signs reviewed. Appearance:  Patient appears stated age, and in no acute distress Eyes:  Pupils are equal, round, and reactive to light and accomodation.  Extraocular movement is intact.  Conjunctivae are not inflamed  Ears:  Canals normal.  Tympanic membranes normal.  Nose:  Mildly congested turbinates.  No sinus tenderness.  Pharynx:  Minimal erythema. Neck:  Supple.  Mildly enlarged lateral nodes are present, tender to palpation on the left.   Lungs:  Clear to auscultation.  Breath sounds are equal.  Moving air well. Heart:  Regular rate and rhythm without murmurs, rubs, or gallops.   Abdomen:  Nontender without masses or hepatosplenomegaly.  Bowel sounds are present.  No CVA or flank tenderness.  Extremities:  No edema.  Skin:  No rash present.   UC Treatments / Results  Labs (all labs ordered are listed, but only abnormal results are displayed) Labs Reviewed  RESP PANEL BY RT-PCR (FLU A&B, COVID) ARPGX2  POCT RAPID STREP A (OFFICE) negative    EKG   Radiology No results found.  Procedures Procedures (including critical care time)  Medications Ordered in UC Medications  acetaminophen (TYLENOL) tablet 650 mg (650 mg Oral Given 02/05/22 1442)    Initial Impression / Assessment and Plan / UC Course  I have reviewed the triage vital signs and the nursing notes.  Pertinent labs & imaging results that were available during my care of the patient were reviewed by me and considered in my medical decision making (see chart for details).    Benign exam.  There is no evidence of bacterial infection today.  Treat symptomatically for now. COVID, FLU PCR pending.  Final Clinical Impressions(s) / UC Diagnoses   Final diagnoses:  Viral URI with cough     Discharge Instructions      Take plain guaifenesin (1200mg  extended release tabs such as Mucinex) twice daily, with plenty of water, for cough and congestion.  May add Pseudoephedrine (30mg , one or two every 4 to 6 hours) for sinus congestion.  Get adequate rest.   May use Afrin nasal spray (or generic oxymetazoline) each morning for about 5 days and then discontinue.  Also recommend using saline nasal spray several times daily and saline nasal irrigation (AYR is a common brand).  Use Flonase nasal spray each morning after using Afrin nasal spray and saline nasal irrigation. Try warm salt water gargles for sore throat.  Stop all antihistamines for now, and other non-prescription cough/cold preparations. May take Delsym Cough Suppressant ("12 Hour Cough Relief") at bedtime for nighttime cough.  May take  Ibuprofen 200mg , 4 tabs every 8 hours with food for headache, fever, etc.   If your COVID-19 test is positive, isolate yourself for five days from today. At the end of five days you may end isolation if your symptoms have cleared or improved, and you have  not had a fever for 24 hours. At this time you should wear a mask for five more days when you are around others.   If symptoms become significantly worse during the night or over the weekend, proceed to the local emergency room.         ED Prescriptions   None       Kandra Nicolas, MD 02/07/22 1443

## 2022-02-05 NOTE — ED Triage Notes (Signed)
Patient c/o sore throat, fever, cough, nasal drainage x 2 days.  Patient has taken OTC Advil and OTC Cold and Flu meds.

## 2022-02-05 NOTE — Discharge Instructions (Addendum)
Take plain guaifenesin (1200mg  extended release tabs such as Mucinex) twice daily, with plenty of water, for cough and congestion.  May add Pseudoephedrine (30mg , one or two every 4 to 6 hours) for sinus congestion.  Get adequate rest.   May use Afrin nasal spray (or generic oxymetazoline) each morning for about 5 days and then discontinue.  Also recommend using saline nasal spray several times daily and saline nasal irrigation (AYR is a common brand).  Use Flonase nasal spray each morning after using Afrin nasal spray and saline nasal irrigation. Try warm salt water gargles for sore throat.  Stop all antihistamines for now, and other non-prescription cough/cold preparations. May take Delsym Cough Suppressant ("12 Hour Cough Relief") at bedtime for nighttime cough.  May take Ibuprofen 200mg , 4 tabs every 8 hours with food for headache, fever, etc.   If your COVID-19 test is positive, isolate yourself for five days from today. At the end of five days you may end isolation if your symptoms have cleared or improved, and you have not had a fever for 24 hours. At this time you should wear a mask for five more days when you are around others.   If symptoms become significantly worse during the night or over the weekend, proceed to the local emergency room.

## 2022-02-19 LAB — TSH: TSH: 1.66 mIU/L

## 2022-02-19 LAB — COMPLETE METABOLIC PANEL WITH GFR
AG Ratio: 1.5 (calc) (ref 1.0–2.5)
ALT: 17 U/L (ref 6–29)
AST: 14 U/L (ref 10–30)
Albumin: 4.4 g/dL (ref 3.6–5.1)
Alkaline phosphatase (APISO): 65 U/L (ref 31–125)
BUN: 9 mg/dL (ref 7–25)
CO2: 25 mmol/L (ref 20–32)
Calcium: 9.5 mg/dL (ref 8.6–10.2)
Chloride: 105 mmol/L (ref 98–110)
Creat: 0.69 mg/dL (ref 0.50–0.96)
Globulin: 3 g/dL (calc) (ref 1.9–3.7)
Glucose, Bld: 81 mg/dL (ref 65–99)
Potassium: 4.3 mmol/L (ref 3.5–5.3)
Sodium: 138 mmol/L (ref 135–146)
Total Bilirubin: 0.7 mg/dL (ref 0.2–1.2)
Total Protein: 7.4 g/dL (ref 6.1–8.1)
eGFR: 123 mL/min/{1.73_m2} (ref >=60)

## 2022-02-19 LAB — CBC WITH DIFFERENTIAL/PLATELET
Absolute Monocytes: 465 cells/uL (ref 200–950)
Basophils Absolute: 30 cells/uL (ref 0–200)
Basophils Relative: 0.3 %
Eosinophils Absolute: 178 cells/uL (ref 15–500)
Eosinophils Relative: 1.8 %
HCT: 42 % (ref 35.0–45.0)
Hemoglobin: 15 g/dL (ref 11.7–15.5)
Lymphs Abs: 1990 cells/uL (ref 850–3900)
MCH: 32.5 pg (ref 27.0–33.0)
MCHC: 35.7 g/dL (ref 32.0–36.0)
MCV: 91.1 fL (ref 80.0–100.0)
MPV: 11.2 fL (ref 7.5–12.5)
Monocytes Relative: 4.7 %
Neutro Abs: 7237 cells/uL (ref 1500–7800)
Neutrophils Relative %: 73.1 %
Platelets: 294 10*3/uL (ref 140–400)
RBC: 4.61 10*6/uL (ref 3.80–5.10)
RDW: 12.4 % (ref 11.0–15.0)
Total Lymphocyte: 20.1 %
WBC: 9.9 10*3/uL (ref 3.8–10.8)

## 2022-02-19 LAB — HEMOGLOBIN A1C
Hgb A1c MFr Bld: 4.9 % of total Hgb (ref <5.7)
Mean Plasma Glucose: 94 mg/dL
eAG (mmol/L): 5.2 mmol/L

## 2022-02-19 LAB — LIPID PANEL W/REFLEX DIRECT LDL
Cholesterol: 181 mg/dL (ref <200)
HDL: 40 mg/dL — ABNORMAL LOW (ref >=50)
LDL Cholesterol (Calc): 116 mg/dL (calc) — ABNORMAL HIGH
Non-HDL Cholesterol (Calc): 141 mg/dL (calc) — ABNORMAL HIGH (ref <130)
Total CHOL/HDL Ratio: 4.5 (calc) (ref <5.0)
Triglycerides: 131 mg/dL (ref <150)

## 2022-02-19 LAB — INSULIN, RANDOM: Insulin: 14.1 u[IU]/mL

## 2022-03-21 IMAGING — US US PELVIS COMPLETE WITH TRANSVAGINAL
1 series · 14 of 25 positions shown · non-contrast
Comparison: None

CLINICAL DATA: Insulin resistance

EXAM:
TRANSABDOMINAL AND TRANSVAGINAL ULTRASOUND OF PELVIS
TECHNIQUE: Both transabdominal and transvaginal ultrasound examinations of the
pelvis were performed. Transabdominal technique was performed for
global imaging of the pelvis including uterus, ovaries, adnexal
regions, and pelvic cul-de-sac. It was necessary to proceed with
endovaginal exam following the transabdominal exam to visualize the
uterus endometrium ovaries.

[Series 1: us pelvis complete with transvaginal · 0.28mm/px · 14 of 101 slices shown]
[im 1/101]
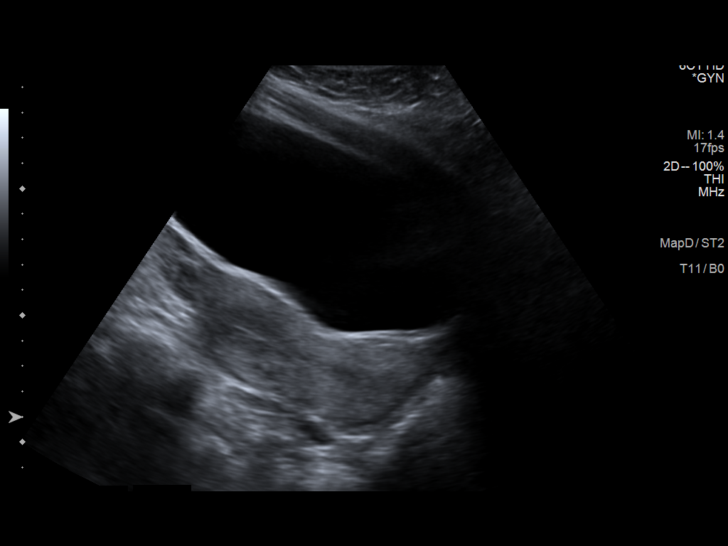
[im 9/101]
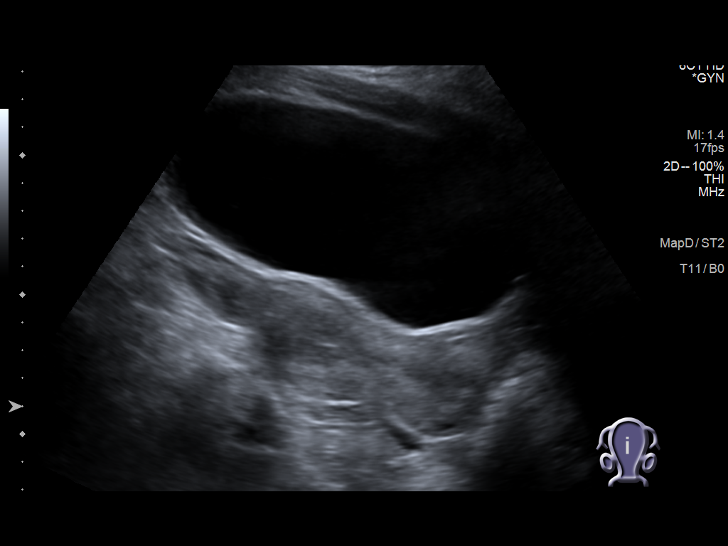
[im 17/101]
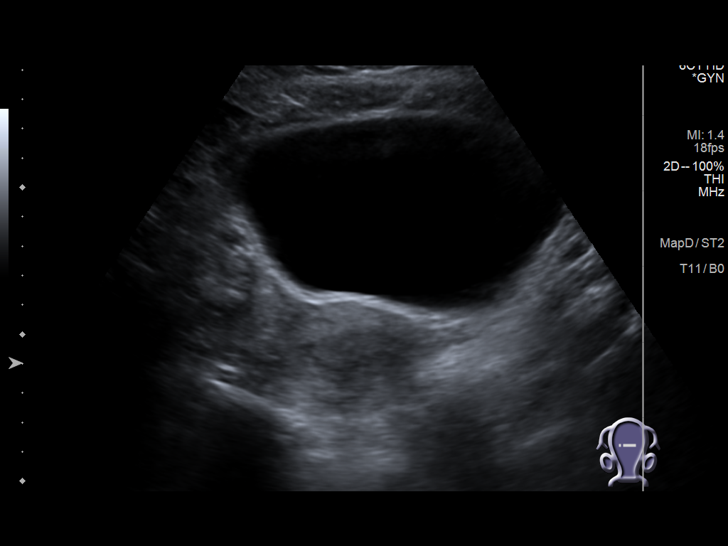
[im 26/101]
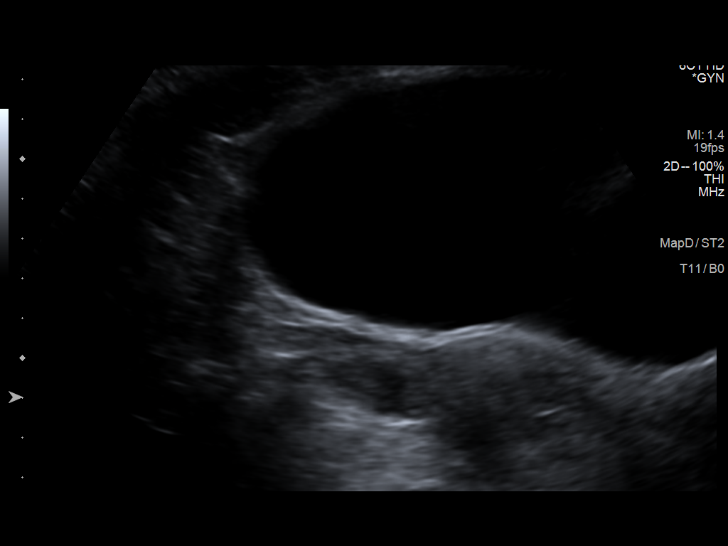
[im 34/101]
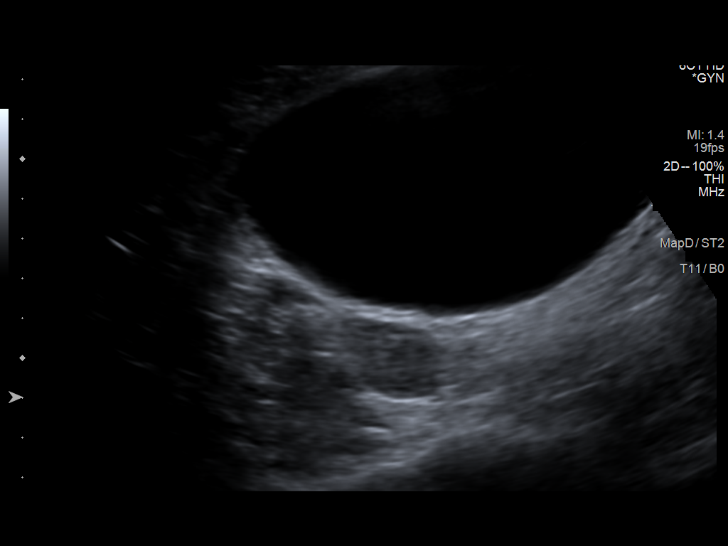
[im 38/101]
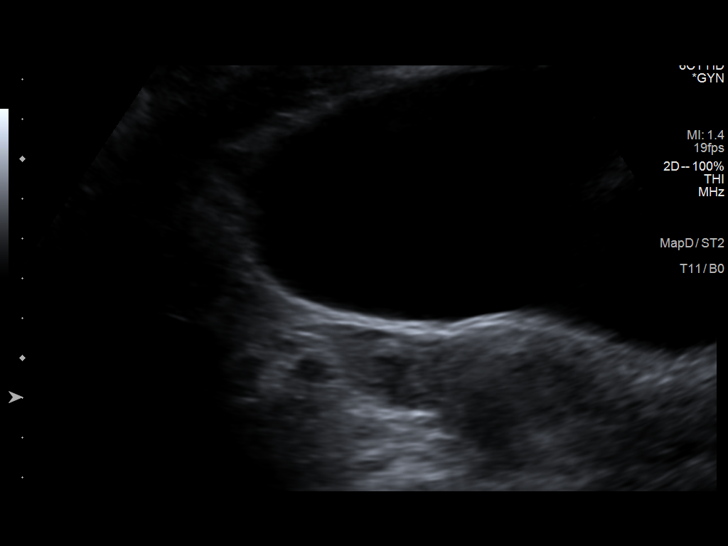
[im 46/101]
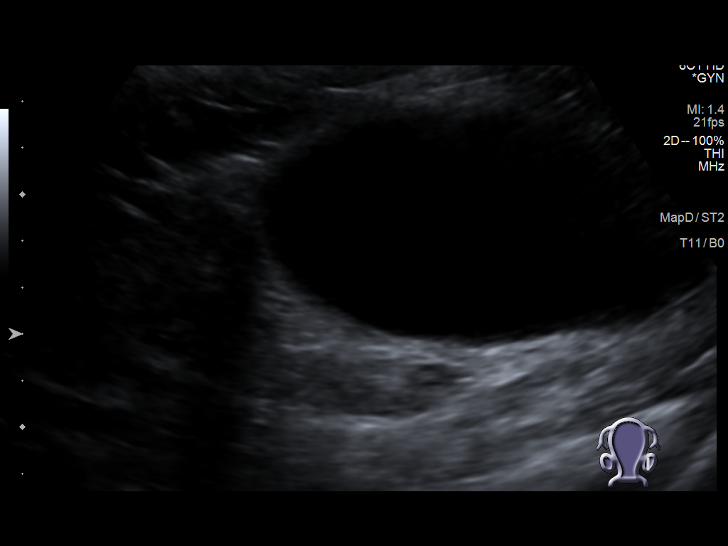
[im 55/101]
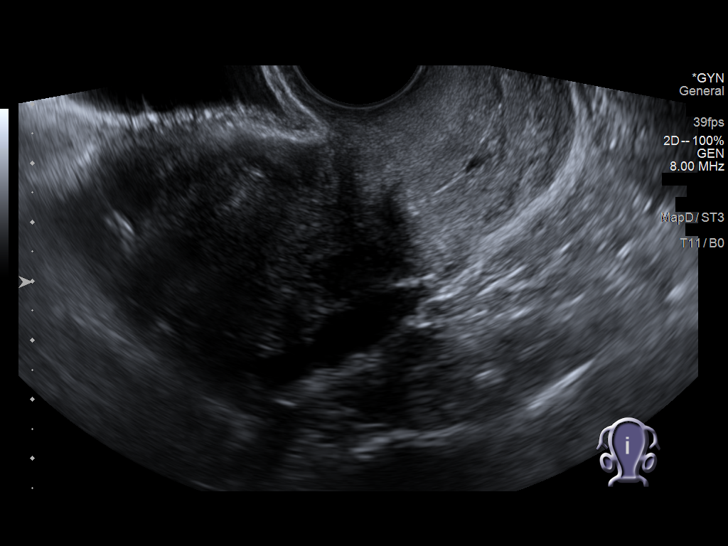
[im 63/101]
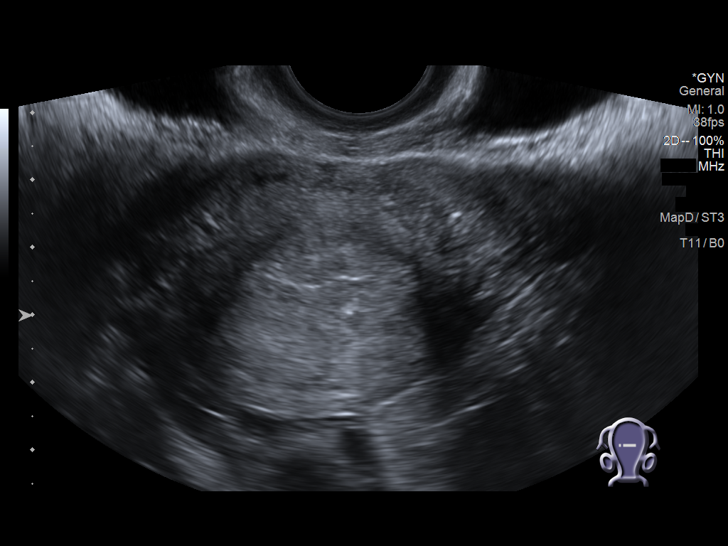
[im 67/101]
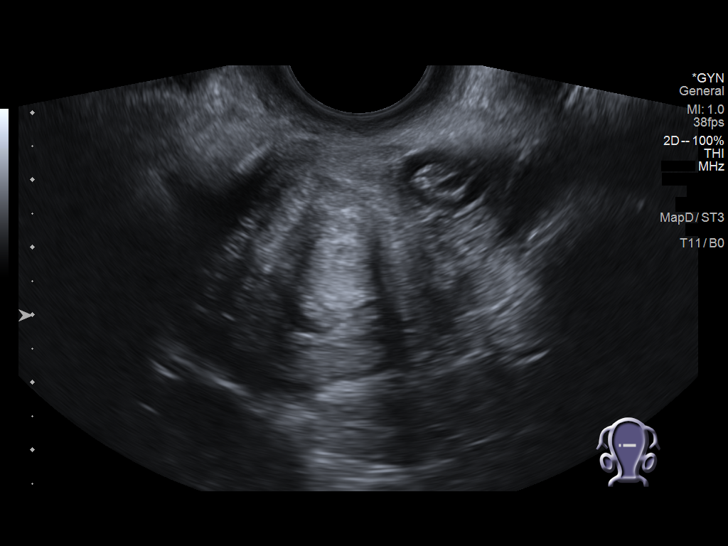
[im 76/101]
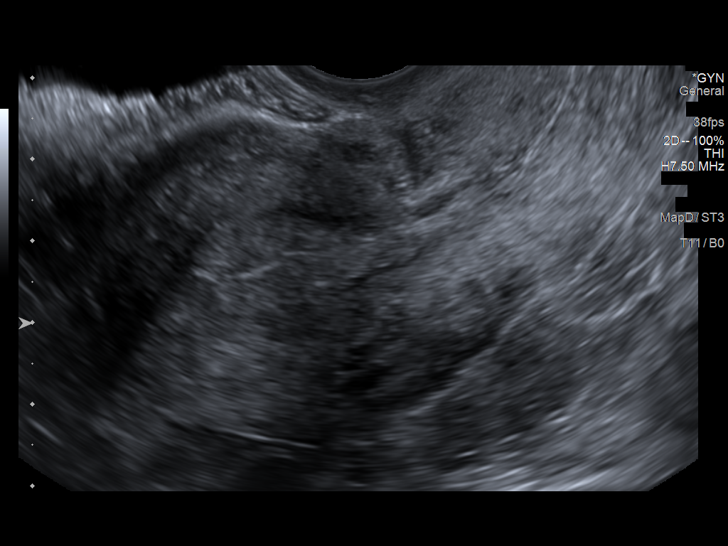
[im 84/101]
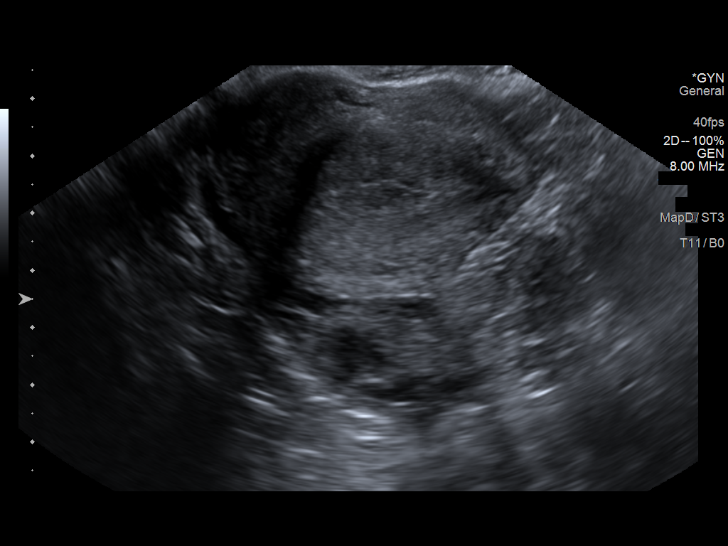
[im 92/101]
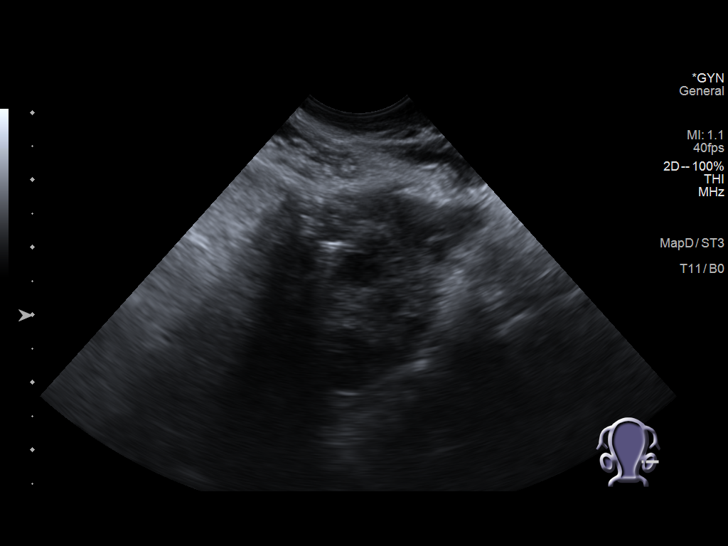
[im 101/101]
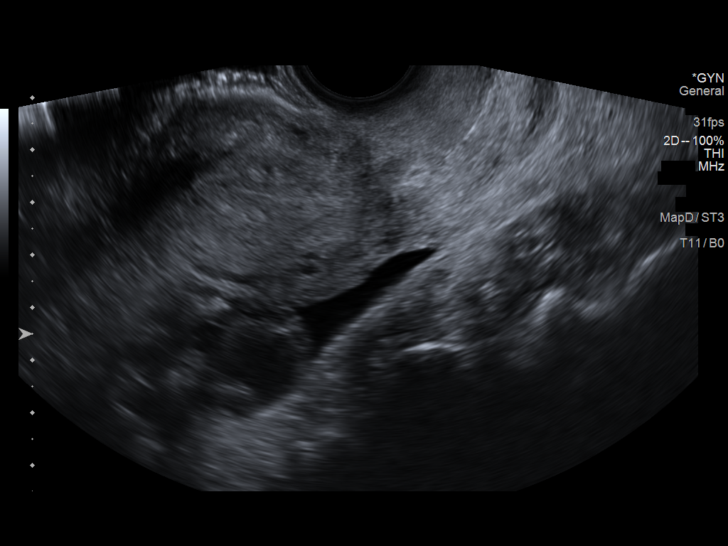

[14 of 25 positions shown; findings below may reference images not displayed]

FINDINGS: Uterus

Measurements: 8.6 x 3.5 x 4.9 cm = volume: 74 mL. No fibroids or
other mass visualized.

Endometrium

Thickness: 11 mm.  No focal abnormality visualized.

Right ovary

Measurements: 3.4 x 1.9 x 2.2 cm = volume: 7 mL.  Negative for mass

Left ovary

Measurements: 3 x 1.8 x 2.1 cm = volume: 6 mL. Normal appearance/no
adnexal mass.

Other findings

Small free fluid in the pelvis
IMPRESSION: Small free fluid in the pelvis. Otherwise negative pelvic ultrasound

## 2022-11-14 ENCOUNTER — Other Ambulatory Visit: Payer: Self-pay | Admitting: Family Medicine

## 2022-11-14 DIAGNOSIS — F418 Other specified anxiety disorders: Secondary | ICD-10-CM

## 2022-11-15 NOTE — Telephone Encounter (Signed)
Pls contact the patient to schedule appt for Zoloft. Not seen since 2023. Meds not renewed since June

## 2022-11-15 NOTE — Telephone Encounter (Signed)
Called patient, LVM to call office to schedule appt for Zoloft refill, thanks.

## 2023-01-03 ENCOUNTER — Encounter: Payer: Self-pay | Admitting: Family Medicine

## 2023-01-03 ENCOUNTER — Telehealth (INDEPENDENT_AMBULATORY_CARE_PROVIDER_SITE_OTHER): Payer: BC Managed Care – PPO | Admitting: Family Medicine

## 2023-01-03 DIAGNOSIS — F418 Other specified anxiety disorders: Secondary | ICD-10-CM

## 2023-01-03 MED ORDER — SERTRALINE HCL 100 MG PO TABS: ORAL_TABLET | ORAL | 2 refills | Status: DC

## 2023-01-03 NOTE — Progress Notes (Signed)
Rhonda Hubbard - 27 y.o. female MRN [id number]  Date of birth: [birth year]   This visit type was conducted due to national recommendations for restrictions regarding the COVID-19 Pandemic (e.g. social distancing).  This format is felt to be most appropriate for this patient at this time.  All issues noted in this document were discussed and addressed.  No physical exam was performed (except for noted visual exam findings with Video Visits).  I discussed the limitations of evaluation and management by telemedicine and the availability of in person appointments. The patient expressed understanding and agreed to proceed.  I connected withNAME@ on 01/03/23 at  4:10 PM EST by a video enabled telemedicine application and verified that I am speaking with the correct person using two identifiers.  Present at visit: Everrett Coombe, DO [facility]   Patient Location: Home [address] [place] [address]   Provider location:   [facility]  Chief Complaint  Patient presents with   Anxiety    HPI  Rhonda Hubbard is a 27 y.o. female who presents via audio/video conferencing for a telehealth visit today.  She is following up for anxiety .  Remains on sertraline 100mg  daily.  She feels that this is still working well for her.  She denies side effects at current strength.  She would like to continue this.     ROS:  A comprehensive ROS was completed and negative except as noted per HPI  Past Medical History:  Diagnosis Date   Anxiety    Insulin resistance     No past surgical history on file.  Family History  Problem Relation Age of Onset   Hypertension Father    Stroke Father    Breast cancer Maternal Aunt     Social History   Socioeconomic History   Marital status: Single    Spouse name: Not on file   Number of children: 0   Years of education: BA   Highest education level: Not on file  Occupational History   Not on file  Tobacco Use   Smoking status: Never   Smokeless tobacco:  Never  Vaping Use   Vaping status: Never Used  Substance and Sexual Activity   Alcohol use: Yes    Alcohol/week: 1.0 - 2.0 standard drink of alcohol    Types: 1 - 2 Standard drinks or equivalent per week    Comment: 1 per month   Drug use: Never   Sexual activity: Not Currently    Partners: Male    Birth control/protection: Abstinence, None  Other Topics Concern   Not on file  Social History Narrative   Lives w/ domestic partner/boyfriend   Caffeine use: 1 per day (soda)   Social Determinants of Health   Financial Resource Strain: Not on file  Food Insecurity: Not on file  Transportation Needs: Not on file  Physical Activity: Not on file  Stress: Not on file  Social Connections: Unknown (07/14/2021)   Received from [facility]   Social Network    Social Network: Not on file  Intimate Partner Violence: Unknown (06/05/2021)   Received from [facility]   HITS    Physically Hurt: Not on file    Insult or Talk Down To: Not on file    Threaten Physical Harm: Not on file    Scream or Curse: Not on file     Current Outpatient Medications:    sertraline (ZOLOFT) 100 MG tablet, TAKE 1 TABLET BY MOUTH EVERY DAY,  Disp: 90 tablet, Rfl: 2  EXAM:  VITALS per patient if applicable: Ht 5\' 3"  (1.6 m)   Wt 180 lb (81.6 kg)   BMI 31.89 kg/m   GENERAL: alert, oriented, appears well and in no acute distress  HEENT: atraumatic, conjunttiva clear, no obvious abnormalities on inspection of external nose and ears  NECK: normal movements of the head and neck  LUNGS: on inspection no signs of respiratory distress, breathing rate appears normal, no obvious gross SOB, gasping or wheezing  CV: no obvious cyanosis  MS: moves all visible extremities without noticeable abnormality  PSYCH/NEURO: pleasant and cooperative, no obvious depression or anxiety, speech and thought processing grossly intact  ASSESSMENT AND PLAN:  Discussed the following assessment  and plan:  Depression with anxiety She continues to do well with sertraline at 100mg .  Will plan to continue. F/u in 6 months.      I discussed the assessment and treatment plan with the patient. The patient was provided an opportunity to ask questions and all were answered. The patient agreed with the plan and demonstrated an understanding of the instructions.   The patient was advised to call back or seek an in-person evaluation if the symptoms worsen or if the condition fails to improve as anticipated.    Everrett Coombe, DO

## 2023-01-03 NOTE — Assessment & Plan Note (Signed)
She continues to do well with sertraline at 100mg .  Will plan to continue. F/u in 6 months.

## 2023-11-05 ENCOUNTER — Other Ambulatory Visit: Payer: Self-pay | Admitting: Family Medicine

## 2023-11-05 DIAGNOSIS — F418 Other specified anxiety disorders: Secondary | ICD-10-CM

## 2023-11-07 NOTE — Telephone Encounter (Signed)
 Pls contact the pt to schedule appt with Dr. Alvia for med refills. Thanks

## 2023-11-20 DIAGNOSIS — F411 Generalized anxiety disorder: Secondary | ICD-10-CM | POA: Insufficient documentation

## 2023-11-20 DIAGNOSIS — F331 Major depressive disorder, recurrent, moderate: Secondary | ICD-10-CM | POA: Insufficient documentation

## 2023-11-20 NOTE — Patient Instructions (Incomplete)

## 2023-11-20 NOTE — Progress Notes (Signed)
 "  New Patient Visit  Subjective:     Patient ID: Rhonda Hubbard, female    DOB: 1996/02/12, 28 y.o.   MRN: [id number]  Chief Complaint  Patient presents with   Establish Care    Anxiety refills, discuss weight management      HPI  Discussed the use of AI scribe software for clinical note transcription with the patient, who gave verbal consent to proceed.  History of Present Illness Rhonda Hubbard is a 28 year old female who presents with concerns about weight management and requests refills on Zoloft .  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 - Has attempted various exercise regimens, with walking being most effective - Unable to achieve further weight loss despite efforts - Lost 15 pounds over one year two years ago following lifestyle modifications - Interested in pharmacologic options for weight loss, specifically inquiring about phentermine   Insulin  resistance and polycystic ovary syndrome evaluation - Previous workup for polycystic ovary syndrome was inconclusive - History of elevated insulin  levels in 2021 or 2022, aware of insulin  resistance - No ovarian cysts identified - No symptoms of hypoglycemia  Anxiety management - Currently taking Zoloft  for anxiety, which is effective - Requests refill of Zoloft  - Concerned about potential interaction between phentermine  and Zoloft , specifically regarding anxiety exacerbation  Sleep patterns - Obtains six to seven hours of sleep per night     ROS Per HPI  Outpatient Encounter Medications as of 11/21/2023  Medication Sig   phentermine  15 MG capsule Take 1 capsule (15 mg total) by mouth every morning.   [DISCONTINUED] sertraline  (ZOLOFT ) 100 MG tablet TAKE 1 TABLET BY MOUTH EVERY DAY   sertraline  (ZOLOFT ) 100 MG tablet TAKE 1 TABLET BY MOUTH EVERY DAY   No facility-administered encounter medications on file  as of 11/21/2023.    Past Medical History:  Diagnosis Date   Anxiety    Insulin  resistance     History reviewed. No pertinent surgical history.  Family History  Problem Relation Age of Onset   Hypertension Father    Stroke Father    Breast cancer Maternal Aunt     Social History   Socioeconomic History   Marital status: Single    Spouse name: Not on file   Number of children: 0   Years of education: BA   Highest education level: Not on file  Occupational History   Not on file  Tobacco Use   Smoking status: Never   Smokeless tobacco: Never  Vaping Use   Vaping status: Never Used  Substance and Sexual Activity   Alcohol use: Yes    Alcohol/week: 1.0 - 2.0 standard drink of alcohol    Types: 1 - 2 Standard drinks or equivalent per week    Comment: 1 per month   Drug use: Never   Sexual activity: Not Currently    Partners: Male    Birth control/protection: Abstinence, None  Other Topics Concern   Not on file  Social History Narrative   Lives w/ domestic partner/boyfriend   Caffeine use: 1 per day (soda)   Social Drivers of Corporate Investment Banker Strain: Not on file  Food Insecurity: Not on file  Transportation Needs: Not on file  Physical Activity: Not on file  Stress: Not on file  Social Connections: Unknown (07/14/2021)   Received from [facility]   Social Network    Social Network: Not on file  Intimate Partner Violence: Unknown (06/05/2021)   Received from [facility]   HITS    Physically Hurt: Not on file    Insult or Talk Down To: Not on file    Threaten Physical Harm: Not on file    Scream or Curse: Not on file       Objective:    BP 116/78 (BP Location: Left Arm, Patient Position: Sitting)   Pulse 85   Temp 98.3 F (36.8 C) (Temporal)   Ht 5' 3 (1.6 m)   Wt 192 lb 3.2 oz (87.2 kg)   LMP 11/10/2023 (Exact Date)   SpO2 99%   BMI 34.05 kg/m    Physical Exam Vitals and nursing note reviewed.  Constitutional:      General: She  is not in acute distress.    Appearance: Normal appearance.  HENT:     Head: Normocephalic and atraumatic.     Right Ear: External ear normal.     Left Ear: External ear normal.     Nose: Nose normal.     Mouth/Throat:     Mouth: Mucous membranes are moist.     Pharynx: Oropharynx is clear.  Eyes:     Extraocular Movements: Extraocular movements intact.     Pupils: Pupils are equal, round, and reactive to light.  Cardiovascular:     Rate and Rhythm: Normal rate and regular rhythm.     Pulses: Normal pulses.     Heart sounds: Normal heart sounds.  Pulmonary:     Effort: Pulmonary effort is normal. No respiratory distress.     Breath sounds: Normal breath sounds. No wheezing, rhonchi or rales.  Musculoskeletal:        General: Normal range of motion.     Cervical back: Normal range of motion.     Right lower leg: No edema.     Left lower leg: No edema.  Lymphadenopathy:     Cervical: No cervical adenopathy.  Neurological:     General: No focal deficit present.     Mental Status: She is alert and oriented to person, place, and time.  Psychiatric:        Mood and Affect: Mood normal.        Thought Content: Thought content normal.     Results for orders placed or performed in visit on 11/21/23  CBC with Differential/Platelet  Result Value Ref Range   WBC 10.8 (H) 4.0 - 10.5 K/uL   RBC 4.72 3.87 - 5.11 Mil/uL   Hemoglobin 14.8 12.0 - 15.0 g/dL   HCT 57.0 63.9 - 53.9 %   MCV 90.8 78.0 - 100.0 fl   MCHC 34.5 30.0 - 36.0 g/dL   RDW 86.7 88.4 - 84.4 %   Platelets 305.0 150.0 - 400.0 K/uL   Neutrophils Relative % 77.8 (H) 43.0 - 77.0 %   Lymphocytes Relative 16.2 12.0 - 46.0 %   Monocytes Relative 4.6 3.0 - 12.0 %   Eosinophils Relative 1.0 0.0 - 5.0 %   Basophils Relative 0.4 0.0 - 3.0 %   Neutro Abs 8.4 (H) 1.4 - 7.7 K/uL   Lymphs Abs 1.7 0.7 - 4.0 K/uL   Monocytes Absolute 0.5 0.1 - 1.0 K/uL   Eosinophils Absolute 0.1 0.0 - 0.7 K/uL   Basophils Absolute 0.0 0.0 - 0.1  K/uL  Comprehensive metabolic panel with GFR  Result Value Ref Range   Sodium 138 135 - 145 mEq/L   Potassium 3.8 3.5 - 5.1 mEq/L   Chloride  107 96 - 112 mEq/L   CO2 22 19 - 32 mEq/L   Glucose, Bld 89 70 - 99 mg/dL   BUN 4 (L) 6 - 23 mg/dL   Creatinine, Ser 9.39 0.40 - 1.20 mg/dL   Total Bilirubin 0.6 0.2 - 1.2 mg/dL   Alkaline Phosphatase 56 39 - 117 U/L   AST 17 0 - 37 U/L   ALT 15 0 - 35 U/L   Total Protein 7.4 6.0 - 8.3 g/dL   Albumin 4.5 3.5 - 5.2 g/dL   GFR 876.64 >39.99 mL/min   Calcium 9.2 8.4 - 10.5 mg/dL  Hemoglobin J8r  Result Value Ref Range   Hgb A1c MFr Bld 5.2 4.6 - 6.5 %  TSH  Result Value Ref Range   TSH 3.47 0.35 - 5.50 uIU/mL  VITAMIN D  25 Hydroxy (Vit-D Deficiency, Fractures)  Result Value Ref Range   VITD 14.00 (L) 30.00 - 100.00 ng/mL  Insulin , random  Result Value Ref Range   Insulin  12.6 uIU/mL  Lipid Profile  Result Value Ref Range   Cholesterol 173 0 - 200 mg/dL   Triglycerides 879.9 0.0 - 149.0 mg/dL   HDL 57.49 >60.99 mg/dL   VLDL 75.9 0.0 - 59.9 mg/dL   LDL Cholesterol 893 (H) 0 - 99 mg/dL   Total CHOL/HDL Ratio 4    NonHDL 130.33         Assessment & Plan:   Assessment and Plan Assessment & Plan Class I obesity with with serious comorbidity (metabolic syndrome) Obesity with insulin  resistance likely hinders weight loss. Discussed phentermine  for weight loss, considering side effects. Considered metformin  for high insulin  levels. Emphasized monitoring anxiety with phentermine . - Order fasting labs: insulin , A1c, blood sugar, liver, kidney function tests. - Order vitamin D  level. - Order cholesterol panel. - Consider phentermine  if insulin  levels not significantly elevated. - Consider metformin  if insulin  levels significantly elevated. - Schedule follow-up in one month to assess treatment response.  Polycystic ovarian syndrome (PCOS) PCOS likely affects weight management. Discussed insulin  resistance role in PCOS and tailored  weight management approach.  Generalized anxiety disorder, major depressive disorder, recurrent, moderate Zoloft  effective for anxiety. Discussed phentermine  interaction, potential anxiety exacerbation, and energy boost. Emphasized Zoloft  continuation. - Continue Zoloft . - Monitor anxiety with potential phentermine  use.  Cervical cancer screening - Referral to OB/GYN today  Follow-Up Discussed follow-up plan to monitor treatment response and adjust as needed. - Schedule follow-up in one month, option for virtual visit.     Orders Placed This Encounter  Procedures   CBC with Differential/Platelet    Release to patient:   Immediate [1]   Comprehensive metabolic panel with GFR    Release to patient:   Immediate [1]   Hemoglobin A1c   TSH   VITAMIN D  25 Hydroxy (Vit-D Deficiency, Fractures)   Insulin , random   Lipid Profile   Ambulatory referral to Obstetrics / Gynecology    Referral Priority:   Routine    Referral Type:   Consultation    Referral Reason:   Specialty Services Required    Requested Specialty:   Obstetrics and Gynecology    Number of Visits Requested:   1     Meds ordered this encounter  Medications   sertraline  (ZOLOFT ) 100 MG tablet    Sig: TAKE 1 TABLET BY MOUTH EVERY DAY    Dispense:  90 tablet    Refill:  2   phentermine  15 MG capsule    Sig: Take 1 capsule (15  mg total) by mouth every morning.    Dispense:  30 capsule    Refill:  0    Return in about 4 weeks (around 12/19/2023) for med f/u.  Corean LITTIE Ku, FNP   "

## 2023-11-21 ENCOUNTER — Encounter: Payer: Self-pay | Admitting: Family Medicine

## 2023-11-21 ENCOUNTER — Ambulatory Visit (INDEPENDENT_AMBULATORY_CARE_PROVIDER_SITE_OTHER): Admitting: Family Medicine

## 2023-11-21 VITALS — BP 116/78 | HR 85 | Temp 98.3°F | Ht 63.0 in | Wt 192.2 lb

## 2023-11-21 DIAGNOSIS — E282 Polycystic ovarian syndrome: Secondary | ICD-10-CM | POA: Diagnosis not present

## 2023-11-21 DIAGNOSIS — Z124 Encounter for screening for malignant neoplasm of cervix: Secondary | ICD-10-CM | POA: Insufficient documentation

## 2023-11-21 DIAGNOSIS — F411 Generalized anxiety disorder: Secondary | ICD-10-CM

## 2023-11-21 DIAGNOSIS — E6609 Other obesity due to excess calories: Secondary | ICD-10-CM

## 2023-11-21 DIAGNOSIS — E66811 Obesity, class 1: Secondary | ICD-10-CM | POA: Diagnosis not present

## 2023-11-21 DIAGNOSIS — E7800 Pure hypercholesterolemia, unspecified: Secondary | ICD-10-CM

## 2023-11-21 DIAGNOSIS — F331 Major depressive disorder, recurrent, moderate: Secondary | ICD-10-CM

## 2023-11-21 DIAGNOSIS — F418 Other specified anxiety disorders: Secondary | ICD-10-CM

## 2023-11-21 DIAGNOSIS — Z6834 Body mass index (BMI) 34.0-34.9, adult: Secondary | ICD-10-CM

## 2023-11-21 LAB — LIPID PANEL
Cholesterol: 173 mg/dL (ref 0–200)
HDL: 42.5 mg/dL (ref >39.00)
LDL Cholesterol: 106 mg/dL — ABNORMAL HIGH (ref 0–99)
NonHDL: 130.33
Total CHOL/HDL Ratio: 4
Triglycerides: 120 mg/dL (ref 0.0–149.0)
VLDL: 24 mg/dL (ref 0.0–40.0)

## 2023-11-21 LAB — CBC WITH DIFFERENTIAL/PLATELET
Basophils Absolute: 0 K/uL (ref 0.0–0.1)
Basophils Relative: 0.4 % (ref 0.0–3.0)
Eosinophils Absolute: 0.1 K/uL (ref 0.0–0.7)
Eosinophils Relative: 1 % (ref 0.0–5.0)
HCT: 42.9 % (ref 36.0–46.0)
Hemoglobin: 14.8 g/dL (ref 12.0–15.0)
Lymphocytes Relative: 16.2 % (ref 12.0–46.0)
Lymphs Abs: 1.7 K/uL (ref 0.7–4.0)
MCHC: 34.5 g/dL (ref 30.0–36.0)
MCV: 90.8 fl (ref 78.0–100.0)
Monocytes Absolute: 0.5 K/uL (ref 0.1–1.0)
Monocytes Relative: 4.6 % (ref 3.0–12.0)
Neutro Abs: 8.4 K/uL — ABNORMAL HIGH (ref 1.4–7.7)
Neutrophils Relative %: 77.8 % — ABNORMAL HIGH (ref 43.0–77.0)
Platelets: 305 K/uL (ref 150.0–400.0)
RBC: 4.72 Mil/uL (ref 3.87–5.11)
RDW: 13.2 % (ref 11.5–15.5)
WBC: 10.8 K/uL — ABNORMAL HIGH (ref 4.0–10.5)

## 2023-11-21 LAB — COMPREHENSIVE METABOLIC PANEL WITH GFR
ALT: 15 U/L (ref 0–35)
AST: 17 U/L (ref 0–37)
Albumin: 4.5 g/dL (ref 3.5–5.2)
Alkaline Phosphatase: 56 U/L (ref 39–117)
BUN: 4 mg/dL — ABNORMAL LOW (ref 6–23)
CO2: 22 meq/L (ref 19–32)
Calcium: 9.2 mg/dL (ref 8.4–10.5)
Chloride: 107 meq/L (ref 96–112)
Creatinine, Ser: 0.6 mg/dL (ref 0.40–1.20)
GFR: 123.35 mL/min (ref >60.00)
Glucose, Bld: 89 mg/dL (ref 70–99)
Potassium: 3.8 meq/L (ref 3.5–5.1)
Sodium: 138 meq/L (ref 135–145)
Total Bilirubin: 0.6 mg/dL (ref 0.2–1.2)
Total Protein: 7.4 g/dL (ref 6.0–8.3)

## 2023-11-21 LAB — VITAMIN D 25 HYDROXY (VIT D DEFICIENCY, FRACTURES): VITD: 14 ng/mL — ABNORMAL LOW (ref 30.00–100.00)

## 2023-11-21 LAB — HEMOGLOBIN A1C: Hgb A1c MFr Bld: 5.2 % (ref 4.6–6.5)

## 2023-11-21 LAB — TSH: TSH: 3.47 u[IU]/mL (ref 0.35–5.50)

## 2023-11-21 MED ORDER — PHENTERMINE HCL 15 MG PO CAPS: 15.0000 mg | ORAL_CAPSULE | ORAL | 0 refills | Status: DC

## 2023-11-21 MED ORDER — SERTRALINE HCL 100 MG PO TABS: ORAL_TABLET | ORAL | 2 refills | Status: DC

## 2023-11-22 LAB — INSULIN, RANDOM: Insulin: 12.6 u[IU]/mL

## 2023-11-24 ENCOUNTER — Encounter: Payer: Self-pay | Admitting: Family Medicine

## 2023-11-25 ENCOUNTER — Ambulatory Visit: Payer: Self-pay | Admitting: Family Medicine

## 2023-11-25 DIAGNOSIS — E559 Vitamin D deficiency, unspecified: Secondary | ICD-10-CM

## 2023-11-25 MED ORDER — VITAMIN D (ERGOCALCIFEROL) 1.25 MG (50000 UNIT) PO CAPS: 50000.0000 [IU] | ORAL_CAPSULE | ORAL | 0 refills | Status: AC

## 2023-12-19 ENCOUNTER — Ambulatory Visit: Admitting: Family Medicine

## 2023-12-19 ENCOUNTER — Encounter: Payer: Self-pay | Admitting: Family Medicine

## 2023-12-19 VITALS — BP 132/90 | HR 101 | Temp 98.4°F | Ht 63.0 in | Wt 188.2 lb

## 2023-12-19 DIAGNOSIS — E66811 Obesity, class 1: Secondary | ICD-10-CM | POA: Diagnosis not present

## 2023-12-19 DIAGNOSIS — Z6834 Body mass index (BMI) 34.0-34.9, adult: Secondary | ICD-10-CM

## 2023-12-19 DIAGNOSIS — E6609 Other obesity due to excess calories: Secondary | ICD-10-CM | POA: Diagnosis not present

## 2023-12-19 MED ORDER — PHENTERMINE HCL 37.5 MG PO TABS: 37.5000 mg | ORAL_TABLET | Freq: Every day | ORAL | 0 refills | Status: DC

## 2023-12-19 NOTE — Patient Instructions (Addendum)
 Increased Phenergan to 37.5 mg capsules once daily in the morning.  Continue this dosing for the next 3 months, follow-up in office then, sooner if needed.

## 2023-12-19 NOTE — Progress Notes (Signed)
 Established Patient Office Visit  Subjective:     Patient ID: Rhonda Hubbard, female    DOB: 08/31/1995, 28 y.o.   MRN: [id number]  No chief complaint on file.   HPI  Discussed the use of AI scribe software for clinical note transcription with the patient, who gave verbal consent to proceed.  History of Present Illness Rhonda Hubbard is a 28 year old female with PCOS who presents for medication evaluation and weight management.  Weight management - Currently taking phentermine  for weight management - No side effects from phentermine  - Some weight loss achieved - Current weight is 188 pounds  Mood and anxiety symptoms - Currently taking sertraline  (Zoloft ) for anxiety and depression - No significant changes in mood or anxiety symptoms - No side effects from sertraline   Sleep and mental health hygiene - Maintains sleep hygiene - Maintains mental health hygiene     ROS Per HPI      Objective:    Ht 5' 3 (1.6 m)   LMP 11/10/2023 (Exact Date)   BMI 34.05 kg/m    Physical Exam Vitals and nursing note reviewed.  Constitutional:      General: She is not in acute distress.    Appearance: Normal appearance. She is obese.  HENT:     Head: Normocephalic and atraumatic.     Right Ear: External ear normal.     Left Ear: External ear normal.     Nose: Nose normal.     Mouth/Throat:     Mouth: Mucous membranes are moist.     Pharynx: Oropharynx is clear.  Eyes:     Extraocular Movements: Extraocular movements intact.     Pupils: Pupils are equal, round, and reactive to light.  Cardiovascular:     Rate and Rhythm: Normal rate and regular rhythm.     Pulses: Normal pulses.     Heart sounds: Normal heart sounds.  Pulmonary:     Effort: Pulmonary effort is normal. No respiratory distress.     Breath sounds: Normal breath sounds. No wheezing, rhonchi or rales.  Musculoskeletal:        General: Normal range of motion.     Cervical back: Normal range of motion.      Right lower leg: No edema.     Left lower leg: No edema.  Lymphadenopathy:     Cervical: No cervical adenopathy.  Neurological:     General: No focal deficit present.     Mental Status: She is alert and oriented to person, place, and time.  Psychiatric:        Mood and Affect: Mood normal.        Thought Content: Thought content normal.     No results found for any visits on 12/19/23.  The ASCVD Risk score (Arnett DK, et al., 2019) failed to calculate for the following reasons:   The 2019 ASCVD risk score is only valid for ages 77 to 88  BP Readings from Last 3 Encounters:  11/21/23 116/78  02/05/22 118/84  02/01/22 123/79   Wt Readings from Last 3 Encounters:  11/21/23 192 lb 3.2 oz (87.2 kg)  01/03/23 180 lb (81.6 kg)  02/05/22 190 lb (86.2 kg)         Assessment & Plan:   Assessment and Plan Assessment & Plan Class I obesity with metabolic syndrome Weight loss satisfactory with phentermine  15 mg daily, no side effects. - Continue phentermine  15 mg oral daily. - Monitor weight and progress. - Consider Topamax  if weight loss plateaus. - Follow-up in three months.  Polycystic ovarian syndrome (PCOS) PCOS likely due to past insulin  resistance. Currently no significant insulin  resistance, regular cycles. - Monitor blood sugars. - Maintain sleep and mental health hygiene.  GAD, MDD, mod, rec Sertraline  100 mg daily well-tolerated, no significant changes or side effects. - Continue sertraline  100 mg oral daily.     No orders of the defined types were placed in this encounter.    Meds ordered this encounter  Medications   phentermine  (ADIPEX-P ) 37.5 MG tablet    Sig: Take 1 tablet (37.5 mg total) by mouth daily before breakfast.    Dispense:  30 tablet    Refill:  0    Return in about 3 months (around 03/20/2024) for meds eval.  Corean LITTIE Ku, FNP

## 2024-01-20 ENCOUNTER — Other Ambulatory Visit: Payer: Self-pay | Admitting: Family Medicine

## 2024-01-20 DIAGNOSIS — E66811 Obesity, class 1: Secondary | ICD-10-CM

## 2024-01-22 ENCOUNTER — Encounter: Payer: Self-pay | Admitting: Family Medicine

## 2024-03-06 ENCOUNTER — Encounter: Payer: Self-pay | Admitting: Family Medicine

## 2024-03-07 MED ORDER — TOPIRAMATE 25 MG PO TABS: ORAL_TABLET | ORAL | 1 refills | Status: AC

## 2024-03-12 ENCOUNTER — Ambulatory Visit: Payer: Self-pay | Admitting: Family Medicine

## 2024-03-20 ENCOUNTER — Ambulatory Visit: Admitting: Family Medicine

## 2024-03-20 ENCOUNTER — Encounter: Payer: Self-pay | Admitting: Family Medicine

## 2024-03-20 VITALS — BP 120/88 | HR 88 | Temp 98.6°F | Ht 63.0 in | Wt 183.8 lb

## 2024-03-20 DIAGNOSIS — E66811 Obesity, class 1: Secondary | ICD-10-CM

## 2024-03-20 DIAGNOSIS — E6609 Other obesity due to excess calories: Secondary | ICD-10-CM

## 2024-03-20 DIAGNOSIS — F331 Major depressive disorder, recurrent, moderate: Secondary | ICD-10-CM

## 2024-03-20 DIAGNOSIS — F411 Generalized anxiety disorder: Secondary | ICD-10-CM | POA: Diagnosis not present

## 2024-03-20 DIAGNOSIS — Z6834 Body mass index (BMI) 34.0-34.9, adult: Secondary | ICD-10-CM | POA: Diagnosis not present

## 2024-03-20 MED ORDER — SERTRALINE HCL 100 MG PO TABS: ORAL_TABLET | ORAL | 2 refills | Status: AC

## 2024-03-20 NOTE — Patient Instructions (Signed)
 Continue current medication regimen.   Follow up with specialists as scheduled.   We are checking labs today, will be in contact with any results that require further attention.  Follow-up with me in 6 mos for medication management, sooner if needed.

## 2024-03-20 NOTE — Progress Notes (Signed)
 "  Established Patient Office Visit  Subjective:     Patient ID: Rhonda Hubbard, female    DOB: March 07, 1995, 29 y.o.   MRN: [id number]  Chief Complaint  Patient presents with   Follow-up    HPI  Discussed the use of AI scribe software for clinical note transcription with the patient, who gave verbal consent to proceed.  History of Present Illness Rhonda Hubbard is a 29 year old female who presents for a three-month follow-up on her medication regimen.  Medication management - Started Topiramate  two weeks ago; has been on the full dose for approximately one week. - Experiences increased energy with Topiramate , takes it earlier in the evening to prevent sleep disruption. - Started Phentermine  at half dose, then titrated to full dose. - Believes one refill of Phentermine  remains.  Laboratory monitoring - Last laboratory evaluation performed around September. - Recalls laboratory results were within normal limits.     ROS Per HPI      Objective:    BP 120/88 (BP Location: Left Arm, Patient Position: Sitting)   Pulse 88   Temp 98.6 F (37 C) (Temporal)   Ht 5' 3 (1.6 m)   Wt 183 lb 12.8 oz (83.4 kg)   SpO2 100%   BMI 32.56 kg/m    Physical Exam Vitals and nursing note reviewed.  Constitutional:      General: She is not in acute distress.    Appearance: Normal appearance.  HENT:     Head: Normocephalic and atraumatic.     Right Ear: External ear normal.     Left Ear: External ear normal.     Nose: Nose normal.     Mouth/Throat:     Mouth: Mucous membranes are moist.     Pharynx: Oropharynx is clear.  Eyes:     Extraocular Movements: Extraocular movements intact.     Pupils: Pupils are equal, round, and reactive to light.  Cardiovascular:     Rate and Rhythm: Normal rate and regular rhythm.     Pulses: Normal pulses.     Heart sounds: Normal heart sounds.  Pulmonary:     Effort: Pulmonary effort is normal. No respiratory distress.     Breath sounds: Normal  breath sounds. No wheezing, rhonchi or rales.  Musculoskeletal:        General: Normal range of motion.     Cervical back: Normal range of motion.     Right lower leg: No edema.     Left lower leg: No edema.  Lymphadenopathy:     Cervical: No cervical adenopathy.  Neurological:     General: No focal deficit present.     Mental Status: She is alert and oriented to person, place, and time.  Psychiatric:        Mood and Affect: Mood normal.        Thought Content: Thought content normal.     No results found for any visits on 03/20/24.   BP Readings from Last 3 Encounters:  03/20/24 120/88  12/19/23 (!) 132/90  11/21/23 116/78   Wt Readings from Last 3 Encounters:  03/20/24 183 lb 12.8 oz (83.4 kg)  12/19/23 188 lb 3.2 oz (85.4 kg)  11/21/23 192 lb 3.2 oz (87.2 kg)      Last CBC Lab Results  Component Value Date   WBC 10.8 (H) 11/21/2023   HGB 14.8 11/21/2023   HCT 42.9 11/21/2023   MCV 90.8 11/21/2023   MCH 32.5 02/18/2022   RDW 13.2 11/21/2023  PLT 305.0 11/21/2023   Last metabolic panel Lab Results  Component Value Date   GLUCOSE 89 11/21/2023   NA 138 11/21/2023   K 3.8 11/21/2023   CL 107 11/21/2023   CO2 22 11/21/2023   BUN 4 (L) 11/21/2023   CREATININE 0.60 11/21/2023   GFR 123.35 11/21/2023   CALCIUM 9.2 11/21/2023   PROT 7.4 11/21/2023   ALBUMIN 4.5 11/21/2023   BILITOT 0.6 11/21/2023   ALKPHOS 56 11/21/2023   AST 17 11/21/2023   ALT 15 11/21/2023         Assessment & Plan:   Assessment and Plan Assessment & Plan Major depressive disorder, recurrent, moderate, GAD Managed with Sertraline  100 mg daily.  - Sent refill for Sertraline  100 mg oral daily. - Schedule follow-up in six months for medication check and refill.  Class I obesity with metabolic syndrome Managed with Phentermine  37.5 mg daily and Topiramate . Reports increased energy, no adverse effects. Labs normal in September. - Continue Phentermine  37.5 mg oral daily. - Consider  adjusting Topiramate  timing if it affects sleep. - Schedule follow-up in six months for medication check and refill.     No orders of the defined types were placed in this encounter.    Meds ordered this encounter  Medications   sertraline  (ZOLOFT ) 100 MG tablet    Sig: TAKE 1 TABLET BY MOUTH EVERY DAY    Dispense:  90 tablet    Refill:  2    Return in about 6 months (around 09/17/2024) for meds OV.  Corean LITTIE Ku, FNP   "

## 2024-09-17 ENCOUNTER — Ambulatory Visit: Admitting: Family Medicine
